# Patient Record
Sex: Female | Born: 1950 | Race: White | Hispanic: No | Marital: Married | Smoking: Former smoker
Health system: Southern US, Community
[De-identification: ages and names within clinical notes are randomized; demographics above are authoritative.]

## PROBLEM LIST (undated history)

## (undated) DIAGNOSIS — I499 Cardiac arrhythmia, unspecified: Secondary | ICD-10-CM

## (undated) DIAGNOSIS — C801 Malignant (primary) neoplasm, unspecified: Secondary | ICD-10-CM

## (undated) HISTORY — PX: ABDOMINAL HYSTERECTOMY: SHX81

---

## 1999-04-05 ENCOUNTER — Other Ambulatory Visit: Admission: RE | Admit: 1999-04-05 | Discharge: 1999-04-05 | Payer: Self-pay | Admitting: Family Medicine

## 2012-06-09 ENCOUNTER — Other Ambulatory Visit: Payer: Self-pay | Admitting: Family Medicine

## 2012-06-12 ENCOUNTER — Other Ambulatory Visit: Payer: Self-pay | Admitting: Family Medicine

## 2012-07-09 ENCOUNTER — Other Ambulatory Visit: Payer: Self-pay | Admitting: Family Medicine

## 2012-07-12 ENCOUNTER — Other Ambulatory Visit: Payer: Self-pay | Admitting: Family Medicine

## 2016-12-19 DIAGNOSIS — Z8541 Personal history of malignant neoplasm of cervix uteri: Secondary | ICD-10-CM | POA: Diagnosis not present

## 2016-12-19 DIAGNOSIS — Z87440 Personal history of urinary (tract) infections: Secondary | ICD-10-CM | POA: Diagnosis not present

## 2016-12-19 DIAGNOSIS — Z87891 Personal history of nicotine dependence: Secondary | ICD-10-CM | POA: Diagnosis not present

## 2016-12-19 DIAGNOSIS — N183 Chronic kidney disease, stage 3 (moderate): Secondary | ICD-10-CM | POA: Diagnosis not present

## 2016-12-19 DIAGNOSIS — R1900 Intra-abdominal and pelvic swelling, mass and lump, unspecified site: Secondary | ICD-10-CM | POA: Diagnosis not present

## 2016-12-19 DIAGNOSIS — R590 Localized enlarged lymph nodes: Secondary | ICD-10-CM | POA: Diagnosis not present

## 2016-12-19 DIAGNOSIS — R161 Splenomegaly, not elsewhere classified: Secondary | ICD-10-CM | POA: Diagnosis not present

## 2016-12-19 DIAGNOSIS — Z90710 Acquired absence of both cervix and uterus: Secondary | ICD-10-CM | POA: Diagnosis not present

## 2016-12-26 DIAGNOSIS — R1900 Intra-abdominal and pelvic swelling, mass and lump, unspecified site: Secondary | ICD-10-CM | POA: Diagnosis not present

## 2016-12-26 DIAGNOSIS — N390 Urinary tract infection, site not specified: Secondary | ICD-10-CM | POA: Diagnosis not present

## 2016-12-26 DIAGNOSIS — Z8541 Personal history of malignant neoplasm of cervix uteri: Secondary | ICD-10-CM | POA: Diagnosis not present

## 2016-12-26 DIAGNOSIS — D739 Disease of spleen, unspecified: Secondary | ICD-10-CM | POA: Diagnosis not present

## 2016-12-26 DIAGNOSIS — N189 Chronic kidney disease, unspecified: Secondary | ICD-10-CM | POA: Diagnosis not present

## 2016-12-26 DIAGNOSIS — R590 Localized enlarged lymph nodes: Secondary | ICD-10-CM | POA: Diagnosis not present

## 2017-01-15 DIAGNOSIS — R1900 Intra-abdominal and pelvic swelling, mass and lump, unspecified site: Secondary | ICD-10-CM | POA: Diagnosis not present

## 2017-01-15 DIAGNOSIS — N189 Chronic kidney disease, unspecified: Secondary | ICD-10-CM | POA: Diagnosis not present

## 2017-01-15 DIAGNOSIS — R591 Generalized enlarged lymph nodes: Secondary | ICD-10-CM | POA: Diagnosis not present

## 2017-01-15 DIAGNOSIS — Z8541 Personal history of malignant neoplasm of cervix uteri: Secondary | ICD-10-CM | POA: Diagnosis not present

## 2017-01-15 DIAGNOSIS — R161 Splenomegaly, not elsewhere classified: Secondary | ICD-10-CM | POA: Diagnosis not present

## 2017-01-23 ENCOUNTER — Other Ambulatory Visit (HOSPITAL_COMMUNITY)
Admission: RE | Admit: 2017-01-23 | Discharge: 2017-01-23 | Disposition: A | Discharge status: Home / Self Care | Payer: Medicare Other | Source: Ambulatory Visit | Attending: Oncology | Admitting: Oncology

## 2017-01-23 DIAGNOSIS — D509 Iron deficiency anemia, unspecified: Secondary | ICD-10-CM | POA: Diagnosis not present

## 2017-01-23 DIAGNOSIS — D739 Disease of spleen, unspecified: Secondary | ICD-10-CM | POA: Diagnosis not present

## 2017-01-23 DIAGNOSIS — D72829 Elevated white blood cell count, unspecified: Secondary | ICD-10-CM | POA: Diagnosis not present

## 2017-01-29 DIAGNOSIS — R161 Splenomegaly, not elsewhere classified: Secondary | ICD-10-CM | POA: Diagnosis not present

## 2017-02-11 ENCOUNTER — Encounter (HOSPITAL_COMMUNITY): Payer: Self-pay

## 2017-02-14 LAB — CHROMOSOME ANALYSIS, BONE MARROW

## 2017-02-17 DIAGNOSIS — N179 Acute kidney failure, unspecified: Secondary | ICD-10-CM

## 2017-02-17 DIAGNOSIS — D649 Anemia, unspecified: Secondary | ICD-10-CM | POA: Diagnosis not present

## 2017-02-17 DIAGNOSIS — R591 Generalized enlarged lymph nodes: Secondary | ICD-10-CM | POA: Diagnosis not present

## 2017-02-17 DIAGNOSIS — I10 Essential (primary) hypertension: Secondary | ICD-10-CM | POA: Diagnosis not present

## 2017-02-17 DIAGNOSIS — E871 Hypo-osmolality and hyponatremia: Secondary | ICD-10-CM

## 2017-02-17 DIAGNOSIS — N183 Chronic kidney disease, stage 3 (moderate): Secondary | ICD-10-CM | POA: Diagnosis not present

## 2017-02-17 DIAGNOSIS — I4891 Unspecified atrial fibrillation: Secondary | ICD-10-CM

## 2017-02-18 DIAGNOSIS — R591 Generalized enlarged lymph nodes: Secondary | ICD-10-CM | POA: Diagnosis not present

## 2017-02-18 DIAGNOSIS — R079 Chest pain, unspecified: Secondary | ICD-10-CM | POA: Diagnosis not present

## 2017-02-18 DIAGNOSIS — E871 Hypo-osmolality and hyponatremia: Secondary | ICD-10-CM | POA: Diagnosis not present

## 2017-02-18 DIAGNOSIS — N179 Acute kidney failure, unspecified: Secondary | ICD-10-CM | POA: Diagnosis not present

## 2017-02-18 DIAGNOSIS — E46 Unspecified protein-calorie malnutrition: Secondary | ICD-10-CM | POA: Diagnosis not present

## 2017-02-18 DIAGNOSIS — R161 Splenomegaly, not elsewhere classified: Secondary | ICD-10-CM

## 2017-02-18 DIAGNOSIS — R200 Anesthesia of skin: Secondary | ICD-10-CM | POA: Diagnosis not present

## 2017-02-18 DIAGNOSIS — D367 Benign neoplasm of other specified sites: Secondary | ICD-10-CM | POA: Diagnosis not present

## 2017-02-18 DIAGNOSIS — N183 Chronic kidney disease, stage 3 (moderate): Secondary | ICD-10-CM

## 2017-02-18 DIAGNOSIS — N390 Urinary tract infection, site not specified: Secondary | ICD-10-CM | POA: Diagnosis not present

## 2017-02-18 DIAGNOSIS — D649 Anemia, unspecified: Secondary | ICD-10-CM | POA: Diagnosis not present

## 2017-02-18 DIAGNOSIS — I4891 Unspecified atrial fibrillation: Secondary | ICD-10-CM | POA: Diagnosis not present

## 2017-02-18 DIAGNOSIS — I10 Essential (primary) hypertension: Secondary | ICD-10-CM | POA: Diagnosis not present

## 2017-02-19 DIAGNOSIS — I10 Essential (primary) hypertension: Secondary | ICD-10-CM | POA: Diagnosis not present

## 2017-02-19 DIAGNOSIS — E871 Hypo-osmolality and hyponatremia: Secondary | ICD-10-CM | POA: Diagnosis not present

## 2017-02-19 DIAGNOSIS — N390 Urinary tract infection, site not specified: Secondary | ICD-10-CM

## 2017-02-19 DIAGNOSIS — R079 Chest pain, unspecified: Secondary | ICD-10-CM | POA: Diagnosis not present

## 2017-02-19 DIAGNOSIS — I4891 Unspecified atrial fibrillation: Secondary | ICD-10-CM | POA: Diagnosis not present

## 2017-02-19 DIAGNOSIS — N179 Acute kidney failure, unspecified: Secondary | ICD-10-CM | POA: Diagnosis not present

## 2017-02-19 DIAGNOSIS — N183 Chronic kidney disease, stage 3 (moderate): Secondary | ICD-10-CM | POA: Diagnosis not present

## 2017-02-20 DIAGNOSIS — N183 Chronic kidney disease, stage 3 (moderate): Secondary | ICD-10-CM | POA: Diagnosis not present

## 2017-02-20 DIAGNOSIS — E871 Hypo-osmolality and hyponatremia: Secondary | ICD-10-CM | POA: Diagnosis not present

## 2017-02-20 DIAGNOSIS — N289 Disorder of kidney and ureter, unspecified: Secondary | ICD-10-CM | POA: Diagnosis not present

## 2017-02-20 DIAGNOSIS — R079 Chest pain, unspecified: Secondary | ICD-10-CM | POA: Diagnosis not present

## 2017-02-20 DIAGNOSIS — R161 Splenomegaly, not elsewhere classified: Secondary | ICD-10-CM

## 2017-02-20 DIAGNOSIS — I4891 Unspecified atrial fibrillation: Secondary | ICD-10-CM | POA: Diagnosis not present

## 2017-02-20 DIAGNOSIS — I10 Essential (primary) hypertension: Secondary | ICD-10-CM | POA: Diagnosis not present

## 2017-02-20 DIAGNOSIS — E46 Unspecified protein-calorie malnutrition: Secondary | ICD-10-CM

## 2017-02-20 DIAGNOSIS — N179 Acute kidney failure, unspecified: Secondary | ICD-10-CM | POA: Diagnosis not present

## 2017-02-20 DIAGNOSIS — D649 Anemia, unspecified: Secondary | ICD-10-CM | POA: Diagnosis not present

## 2017-02-20 DIAGNOSIS — R599 Enlarged lymph nodes, unspecified: Secondary | ICD-10-CM | POA: Diagnosis not present

## 2017-02-21 DIAGNOSIS — E871 Hypo-osmolality and hyponatremia: Secondary | ICD-10-CM | POA: Diagnosis not present

## 2017-02-21 DIAGNOSIS — I4891 Unspecified atrial fibrillation: Secondary | ICD-10-CM | POA: Diagnosis not present

## 2017-02-21 DIAGNOSIS — N179 Acute kidney failure, unspecified: Secondary | ICD-10-CM | POA: Diagnosis not present

## 2017-02-22 DIAGNOSIS — I4891 Unspecified atrial fibrillation: Secondary | ICD-10-CM | POA: Diagnosis not present

## 2017-02-22 DIAGNOSIS — N179 Acute kidney failure, unspecified: Secondary | ICD-10-CM | POA: Diagnosis not present

## 2017-02-22 DIAGNOSIS — E871 Hypo-osmolality and hyponatremia: Secondary | ICD-10-CM | POA: Diagnosis not present

## 2017-02-26 DIAGNOSIS — N189 Chronic kidney disease, unspecified: Secondary | ICD-10-CM | POA: Diagnosis not present

## 2017-02-26 DIAGNOSIS — R161 Splenomegaly, not elsewhere classified: Secondary | ICD-10-CM | POA: Diagnosis not present

## 2017-02-26 DIAGNOSIS — Z87440 Personal history of urinary (tract) infections: Secondary | ICD-10-CM | POA: Diagnosis not present

## 2017-02-26 DIAGNOSIS — E46 Unspecified protein-calorie malnutrition: Secondary | ICD-10-CM | POA: Diagnosis not present

## 2017-02-26 DIAGNOSIS — D367 Benign neoplasm of other specified sites: Secondary | ICD-10-CM | POA: Diagnosis not present

## 2017-02-26 DIAGNOSIS — D649 Anemia, unspecified: Secondary | ICD-10-CM | POA: Diagnosis not present

## 2017-02-26 DIAGNOSIS — Z7901 Long term (current) use of anticoagulants: Secondary | ICD-10-CM | POA: Diagnosis not present

## 2017-02-26 DIAGNOSIS — D696 Thrombocytopenia, unspecified: Secondary | ICD-10-CM | POA: Diagnosis not present

## 2017-02-26 DIAGNOSIS — I4891 Unspecified atrial fibrillation: Secondary | ICD-10-CM | POA: Diagnosis not present

## 2017-02-26 DIAGNOSIS — D72829 Elevated white blood cell count, unspecified: Secondary | ICD-10-CM | POA: Diagnosis not present

## 2017-03-06 ENCOUNTER — Other Ambulatory Visit (HOSPITAL_COMMUNITY)
Admission: RE | Admit: 2017-03-06 | Discharge: 2017-03-06 | Disposition: A | Discharge status: Home / Self Care | Payer: Medicare Other | Source: Ambulatory Visit | Attending: Surgery | Admitting: Surgery

## 2017-03-09 ENCOUNTER — Inpatient Hospital Stay (HOSPITAL_COMMUNITY)
Admission: AD | Admit: 2017-03-09 | Discharge: 2017-03-11 | DRG: 055 | Disposition: A | Discharge status: Home / Self Care | Payer: Medicare Other | Source: Other Acute Inpatient Hospital | Attending: Internal Medicine | Admitting: Internal Medicine

## 2017-03-09 ENCOUNTER — Observation Stay (HOSPITAL_COMMUNITY): Payer: Medicare Other

## 2017-03-09 ENCOUNTER — Encounter (HOSPITAL_COMMUNITY): Payer: Self-pay | Admitting: *Deleted

## 2017-03-09 DIAGNOSIS — J90 Pleural effusion, not elsewhere classified: Secondary | ICD-10-CM

## 2017-03-09 DIAGNOSIS — D72828 Other elevated white blood cell count: Secondary | ICD-10-CM | POA: Diagnosis not present

## 2017-03-09 DIAGNOSIS — N183 Chronic kidney disease, stage 3 unspecified: Secondary | ICD-10-CM | POA: Diagnosis present

## 2017-03-09 DIAGNOSIS — C8590 Non-Hodgkin lymphoma, unspecified, unspecified site: Secondary | ICD-10-CM | POA: Diagnosis present

## 2017-03-09 DIAGNOSIS — C7931 Secondary malignant neoplasm of brain: Secondary | ICD-10-CM | POA: Diagnosis not present

## 2017-03-09 DIAGNOSIS — R51 Headache: Secondary | ICD-10-CM | POA: Diagnosis present

## 2017-03-09 DIAGNOSIS — Z87891 Personal history of nicotine dependence: Secondary | ICD-10-CM

## 2017-03-09 DIAGNOSIS — G40409 Other generalized epilepsy and epileptic syndromes, not intractable, without status epilepticus: Secondary | ICD-10-CM | POA: Diagnosis not present

## 2017-03-09 DIAGNOSIS — Z880 Allergy status to penicillin: Secondary | ICD-10-CM | POA: Diagnosis not present

## 2017-03-09 DIAGNOSIS — R569 Unspecified convulsions: Secondary | ICD-10-CM

## 2017-03-09 DIAGNOSIS — Z7901 Long term (current) use of anticoagulants: Secondary | ICD-10-CM | POA: Diagnosis not present

## 2017-03-09 DIAGNOSIS — C8330 Diffuse large B-cell lymphoma, unspecified site: Secondary | ICD-10-CM

## 2017-03-09 DIAGNOSIS — G939 Disorder of brain, unspecified: Secondary | ICD-10-CM

## 2017-03-09 DIAGNOSIS — C8190 Hodgkin lymphoma, unspecified, unspecified site: Secondary | ICD-10-CM | POA: Diagnosis not present

## 2017-03-09 DIAGNOSIS — D631 Anemia in chronic kidney disease: Secondary | ICD-10-CM | POA: Diagnosis present

## 2017-03-09 DIAGNOSIS — G9389 Other specified disorders of brain: Secondary | ICD-10-CM | POA: Diagnosis present

## 2017-03-09 DIAGNOSIS — I4891 Unspecified atrial fibrillation: Secondary | ICD-10-CM | POA: Diagnosis present

## 2017-03-09 DIAGNOSIS — I480 Paroxysmal atrial fibrillation: Secondary | ICD-10-CM | POA: Diagnosis present

## 2017-03-09 DIAGNOSIS — I129 Hypertensive chronic kidney disease with stage 1 through stage 4 chronic kidney disease, or unspecified chronic kidney disease: Secondary | ICD-10-CM | POA: Diagnosis not present

## 2017-03-09 DIAGNOSIS — Z79899 Other long term (current) drug therapy: Secondary | ICD-10-CM

## 2017-03-09 DIAGNOSIS — I10 Essential (primary) hypertension: Secondary | ICD-10-CM | POA: Diagnosis present

## 2017-03-09 HISTORY — DX: Cardiac arrhythmia, unspecified: I49.9

## 2017-03-09 HISTORY — DX: Malignant (primary) neoplasm, unspecified: C80.1

## 2017-03-09 LAB — CBC WITH DIFFERENTIAL/PLATELET
BASOS PCT: 0 %
Basophils Absolute: 0 10*3/uL (ref 0.0–0.1)
Eosinophils Absolute: 0.1 10*3/uL (ref 0.0–0.7)
Eosinophils Relative: 1 %
HEMATOCRIT: 31.2 % — AB (ref 36.0–46.0)
HEMOGLOBIN: 9.7 g/dL — AB (ref 12.0–15.0)
LYMPHS ABS: 1.1 10*3/uL (ref 0.7–4.0)
Lymphocytes Relative: 5 %
MCH: 24.1 pg — AB (ref 26.0–34.0)
MCHC: 31.1 g/dL (ref 30.0–36.0)
MCV: 77.6 fL — AB (ref 78.0–100.0)
MONO ABS: 1.7 10*3/uL — AB (ref 0.1–1.0)
MONOS PCT: 8 %
NEUTROS ABS: 18.4 10*3/uL — AB (ref 1.7–7.7)
NEUTROS PCT: 86 %
Platelets: 561 10*3/uL — ABNORMAL HIGH (ref 150–400)
RBC: 4.02 MIL/uL (ref 3.87–5.11)
RDW: 17.7 % — AB (ref 11.5–15.5)
WBC: 21.4 10*3/uL — ABNORMAL HIGH (ref 4.0–10.5)

## 2017-03-09 LAB — COMPREHENSIVE METABOLIC PANEL
ALBUMIN: 1.9 g/dL — AB (ref 3.5–5.0)
ALT: 7 U/L — AB (ref 14–54)
AST: 22 U/L (ref 15–41)
Alkaline Phosphatase: 74 U/L (ref 38–126)
Anion gap: 8 (ref 5–15)
BILIRUBIN TOTAL: 0.8 mg/dL (ref 0.3–1.2)
BUN: 30 mg/dL — AB (ref 6–20)
CHLORIDE: 104 mmol/L (ref 101–111)
CO2: 19 mmol/L — ABNORMAL LOW (ref 22–32)
CREATININE: 1.77 mg/dL — AB (ref 0.44–1.00)
Calcium: 8.5 mg/dL — ABNORMAL LOW (ref 8.9–10.3)
GFR calc Af Amer: 33 mL/min — ABNORMAL LOW (ref >60)
GFR, EST NON AFRICAN AMERICAN: 29 mL/min — AB (ref >60)
GLUCOSE: 79 mg/dL (ref 65–99)
POTASSIUM: 4.4 mmol/L (ref 3.5–5.1)
Sodium: 131 mmol/L — ABNORMAL LOW (ref 135–145)
TOTAL PROTEIN: 5.8 g/dL — AB (ref 6.5–8.1)

## 2017-03-09 LAB — MAGNESIUM: MAGNESIUM: 1.9 mg/dL (ref 1.7–2.4)

## 2017-03-09 LAB — PHOSPHORUS: Phosphorus: 4.2 mg/dL (ref 2.5–4.6)

## 2017-03-09 MED ORDER — ACETAMINOPHEN 325 MG PO TABS
650.0000 mg | ORAL_TABLET | Freq: Four times a day (QID) | ORAL | Status: DC | PRN
  Administered 2017-03-09 – 2017-03-10 (×4): 650 mg via ORAL
  Filled 2017-03-09 (×4): qty 2

## 2017-03-09 MED ORDER — ONDANSETRON HCL 4 MG/2ML IJ SOLN: 4.0000 mg | Freq: Four times a day (QID) | INTRAMUSCULAR | Status: DC | PRN

## 2017-03-09 MED ORDER — ONDANSETRON HCL 4 MG PO TABS: 4.0000 mg | ORAL_TABLET | Freq: Four times a day (QID) | ORAL | Status: DC | PRN

## 2017-03-09 MED ORDER — ACETAMINOPHEN 650 MG RE SUPP: 650.0000 mg | Freq: Four times a day (QID) | RECTAL | Status: DC | PRN

## 2017-03-09 MED ORDER — LEVETIRACETAM 500 MG/5ML IV SOLN
500.0000 mg | Freq: Two times a day (BID) | INTRAVENOUS | Status: DC
  Administered 2017-03-09 – 2017-03-11 (×4): 500 mg via INTRAVENOUS
  Filled 2017-03-09 (×4): qty 5

## 2017-03-09 NOTE — H&P (Signed)
History and Physical    Helen Douglas GEZ:[id number] DOB: 08/16/1950 DOA: 03/09/2017  PCP: No primary care provider on file.  Patient coming from: Patient was transferred from [facility].  Chief Complaint: Brain mass and seizures.  HPI: Helen Douglas is a 66 y.o. female with history of chronic kidney disease and chronic anemia with recent diagnosis of Hodgkin's lymphoma and atrial fibrillation was witnessed to have a seizure at home by patient's family at around 46 AM today while patient was sitting on the chair.  As per the patient's son who provided the history patient had a generalized tonic-clonic seizure which lasted for a few minutes and EMS was called and by then patient became confused.  Patient was brought to the ER at [facility].  As per the history by then the patient's seizure is tolerable patient was postictal.  CT of the head done initially without contrast followed by with contrast was done which showed enhancing 25 into 17-16 mm mass in the left middle cranial fossa which appears extra-axial in location.  Adjacent vasogenic not cytotoxic edema.  This lesion cannot be seen on recent MRI.  Differentials include meningioma versus metastatic disease from patient's lymphoma.  Patient was loaded with Keppra 1 g IV.  Chest x-ray done showed left pleural effusion.  EKG was showing normal sinus rhythm.  CBC WBCs 25 hemoglobin is 11.2 platelets are 647 sodium is 135 potassium is 5 BUN 30 creatinine 1.7 AST 40 ALT 17 alk phos 104 calcium 9.5 corrected calcium 10.5.   Patient states over the last 2 weeks patient has been feeling numb on the left side of the face with left temporal headache.  Patient also was not feeling well 3 weeks ago which prompted the workup for lymphoma.  Had biopsy of the right axillary lymph node last week.  Patient follows with oncologist Dr. Hinton Rao in [place].  ED Course: Patient was a direct admit.  Review of Systems: As per HPI, rest all  negative.   Past Medical History:  Diagnosis Date  . Cancer ([place])   . Dysrhythmia     Past Surgical History:  Procedure Laterality Date  . ABDOMINAL HYSTERECTOMY       reports that she quit smoking about 2 years ago. Her smoking use included Cigarettes. She has never used smokeless tobacco. She reports that she does not drink alcohol or use drugs.  Not on File  Family History  Problem Relation Age of Onset  . CAD Mother   . CAD Father   . CAD Son     Prior to Admission medications   Not on File    Physical Exam: Vitals:   03/09/17 1855 03/09/17 1905 03/09/17 2053  BP: (!) 117/37 (!) 114/40 (!) 121/48  Pulse: 74  77  Resp: 18  18  Temp: 97.7 F (36.5 C)  97.6 F (36.4 C)  TempSrc: Oral  Oral  SpO2: 96%  95%  Weight: 93.6 kg (206 lb 6.4 oz)    Height: 5' 5"  (1.651 m)        Constitutional: Moderately built and nourished. Vitals:   03/09/17 1855 03/09/17 1905 03/09/17 2053  BP: (!) 117/37 (!) 114/40 (!) 121/48  Pulse: 74  77  Resp: 18  18  Temp: 97.7 F (36.5 C)  97.6 F (36.4 C)  TempSrc: Oral  Oral  SpO2: 96%  95%  Weight: 93.6 kg (206 lb 6.4 oz)    Height: 5' 5"  (1.651 m)  Eyes: Anicteric no pallor. ENMT: No discharge from the ears eyes nose or mouth. Neck: Bilateral neck mass felt.  No neck rigidity. Respiratory: No rhonchi or crepitations. Cardiovascular: S1-S2 heard. Abdomen: Mildly tender in the left upper quadrant.  No guarding or rigidity. Musculoskeletal: No edema. Skin: No rash. Neurologic: Alert awake oriented to time place and person.  Moves all extremities. Psychiatric: Appears normal.   Labs on Admission: I have personally reviewed following labs and imaging studies  CBC: No results for input(s): WBC, NEUTROABS, HGB, HCT, MCV, PLT in the last 168 hours. Basic Metabolic Panel: No results for input(s): NA, K, CL, CO2, GLUCOSE, BUN, CREATININE, CALCIUM, MG, PHOS in the last 168 hours. GFR: CrCl cannot be calculated (No order  found.). Liver Function Tests: No results for input(s): AST, ALT, ALKPHOS, BILITOT, PROT, ALBUMIN in the last 168 hours. No results for input(s): LIPASE, AMYLASE in the last 168 hours. No results for input(s): AMMONIA in the last 168 hours. Coagulation Profile: No results for input(s): INR, PROTIME in the last 168 hours. Cardiac Enzymes: No results for input(s): CKTOTAL, CKMB, CKMBINDEX, TROPONINI in the last 168 hours. BNP (last 3 results) No results for input(s): PROBNP in the last 8760 hours. HbA1C: No results for input(s): HGBA1C in the last 72 hours. CBG: No results for input(s): GLUCAP in the last 168 hours. Lipid Profile: No results for input(s): CHOL, HDL, LDLCALC, TRIG, CHOLHDL, LDLDIRECT in the last 72 hours. Thyroid Function Tests: No results for input(s): TSH, T4TOTAL, FREET4, T3FREE, THYROIDAB in the last 72 hours. Anemia Panel: No results for input(s): VITAMINB12, FOLATE, FERRITIN, TIBC, IRON, RETICCTPCT in the last 72 hours. Urine analysis: No results found for: COLORURINE, APPEARANCEUR, LABSPEC, PHURINE, GLUCOSEU, HGBUR, BILIRUBINUR, KETONESUR, PROTEINUR, UROBILINOGEN, NITRITE, LEUKOCYTESUR Sepsis Labs: @LABRCNTIP (procalcitonin:4,lacticidven:4) )No results found for this or any previous visit (from the past 240 hour(s)).   Radiological Exams on Admission: No results found.  EKG: Independently reviewed.  Normal sinus rhythm.  EKG done at [facility].  Assessment/Plan Active Problems:   CKD (chronic kidney disease) stage 3, GFR 30-59 ml/min (HCC)   Essential hypertension   Atrial fibrillation (HCC)   Lymphoma (HCC)   Brain mass   Seizures ([place])    1. Brain mass with seizure - discussed with Dr. Vertell Limber on-call neurosurgeon will be seeing patient in consult.  Dr. Vertell Limber has requested to get MRI brain without contrast (due to chronic kidney disease unable to give contrast) and brain lab protocol.  Have also discussed with on-call neurologist Dr. Cheral Marker who  has advised at this time to continue Keppra 500 mg IV every 12 for seizure precautions (discussed with neurologist about the renal clearance). 2. Paroxysmal atrial fibrillation -presently in sinus rhythm.  Holding off apixaban due to possible procedures, family agreeable.  On metoprolol. 3. Chronic kidney disease stage III - check metabolic panel.  Follow intake output. 4. Chronic anemia probably from chronic kidney disease - follow CBC. 5. Recently diagnosed lymphoma - per oncologist. 6. Leukocytosis - likely from [place]. 7. Left pleural effusion per the chest x-ray done at [facility] - we will do 2 view chest x-ray and based on which we will see if patient needs thoracentesis.  Patient presently not in distress. 8. Hypertension on metoprolol.   DVT prophylaxis: SCDs in anticipation of procedure. Code Status: Full code. Family Communication: Son and daughter. Disposition Plan: Home. Consults called: Neurosurgery. Admission status: Observation.   Rise Patience MD [facility] Pager [phone].  If 7PM-7AM, please contact night-coverage [email or web address] Password  [email or web address]  03/09/2017, 10:01 PM

## 2017-03-09 NOTE — Plan of Care (Signed)
Ms. Helen Douglas is a 66 year old female with medical history significant for non--Hodgkin's lymphoma (unclear diagnosis, lymph node biopsy 10/18) followed by Dr. Hosie Poisson who presented to the ED with reports of seizure-like activity.  Family states patient was at her usual state of health until this morning.  Family reports patient was less alert, had difficulty answering questions, and had an episode of whole body jerking, and shaking head.  Per ED provider (PA Kendricks) patient arrived in somewhat of a post ictal state the patient was loaded with Keppra.  Since then patient has become more alert and is oriented.   Reported vitals: blood pressure 90-110/50-60.  For which patient was given IV fluids.  Lab work notable for leukocytosis of 25 (previously 17 on 10/13) , creatinine 1.7 (baseline 1.3-1.7).  Patient has not had any seizure-like activity in the ED.    Given patient's complaint of headache CT head was obtained which was concerning for 25 x 17 x 16 mm enhancing mass.  Concern for meningioma versus metastatic lesion.  No reported midline shift.  Localized mass-effect  Of note brain MRI without contrast on 10/3 did not show any brain lesion per radiologist report  Patient will come to [facility] observation for possible neurosurgical recommendations for mass.  --per nursing will likely need in and out cath orders  Nurse please let flow manager know of patient's arrival to the floor  Shayla D Nettey

## 2017-03-09 NOTE — Progress Notes (Signed)
Helen Douglas [id number] Admitted to 9I33: 03/09/2017 8:12 PM Attending Provider: Rise Patience, MD    Helen Douglas is a 66 y.o. female patient admitted from ED awake, alert  & orientated  X 3,  No Order, VSS - Blood pressure (!) 114/40, pulse 74, temperature 97.7 F (36.5 C), temperature source Oral, resp. rate 18, height 5\' 5"  (1.651 m), weight 93.6 kg (206 lb 6.4 oz), SpO2 96 %.RA, no c/o shortness of breath, no c/o chest pain, no distress noted.    IV site WDL:  with a transparent dsg that's clean dry and intact.  Allergies:  Allergies not on file   No past medical history on file.  History:  obtained from patient  Pt orientation to unit, room and routine. Information packet given to patient. Admission INP armband ID verified with patient/family, and in place. SR up x 2, fall risk assessment complete with Patient and family verbalizing understanding of risks associated with falls. Pt verbalizes an understanding of how to use the call bell and to call for help before getting out of bed.  Skin, see surgical incision flow sheet.   Will cont to monitor and assist as needed.  Parthenia Ames, RN 03/09/2017 8:12 PM

## 2017-03-10 ENCOUNTER — Observation Stay (HOSPITAL_COMMUNITY): Payer: Medicare Other

## 2017-03-10 DIAGNOSIS — I129 Hypertensive chronic kidney disease with stage 1 through stage 4 chronic kidney disease, or unspecified chronic kidney disease: Secondary | ICD-10-CM | POA: Diagnosis present

## 2017-03-10 DIAGNOSIS — C8330 Diffuse large B-cell lymphoma, unspecified site: Secondary | ICD-10-CM | POA: Diagnosis present

## 2017-03-10 DIAGNOSIS — N183 Chronic kidney disease, stage 3 (moderate): Secondary | ICD-10-CM | POA: Diagnosis present

## 2017-03-10 DIAGNOSIS — C8190 Hodgkin lymphoma, unspecified, unspecified site: Secondary | ICD-10-CM | POA: Diagnosis present

## 2017-03-10 DIAGNOSIS — R569 Unspecified convulsions: Secondary | ICD-10-CM | POA: Diagnosis not present

## 2017-03-10 DIAGNOSIS — G939 Disorder of brain, unspecified: Secondary | ICD-10-CM | POA: Diagnosis present

## 2017-03-10 DIAGNOSIS — Z7901 Long term (current) use of anticoagulants: Secondary | ICD-10-CM | POA: Diagnosis not present

## 2017-03-10 DIAGNOSIS — R0902 Hypoxemia: Secondary | ICD-10-CM

## 2017-03-10 DIAGNOSIS — Z87891 Personal history of nicotine dependence: Secondary | ICD-10-CM | POA: Diagnosis not present

## 2017-03-10 DIAGNOSIS — I10 Essential (primary) hypertension: Secondary | ICD-10-CM | POA: Diagnosis not present

## 2017-03-10 DIAGNOSIS — J90 Pleural effusion, not elsewhere classified: Secondary | ICD-10-CM

## 2017-03-10 DIAGNOSIS — C7931 Secondary malignant neoplasm of brain: Secondary | ICD-10-CM | POA: Diagnosis present

## 2017-03-10 DIAGNOSIS — R0602 Shortness of breath: Secondary | ICD-10-CM | POA: Diagnosis not present

## 2017-03-10 DIAGNOSIS — D72828 Other elevated white blood cell count: Secondary | ICD-10-CM | POA: Diagnosis present

## 2017-03-10 DIAGNOSIS — C8338 Diffuse large B-cell lymphoma, lymph nodes of multiple sites: Secondary | ICD-10-CM | POA: Diagnosis not present

## 2017-03-10 DIAGNOSIS — D631 Anemia in chronic kidney disease: Secondary | ICD-10-CM | POA: Diagnosis present

## 2017-03-10 DIAGNOSIS — Z79899 Other long term (current) drug therapy: Secondary | ICD-10-CM | POA: Diagnosis not present

## 2017-03-10 DIAGNOSIS — Z880 Allergy status to penicillin: Secondary | ICD-10-CM | POA: Diagnosis not present

## 2017-03-10 DIAGNOSIS — I480 Paroxysmal atrial fibrillation: Secondary | ICD-10-CM | POA: Diagnosis present

## 2017-03-10 DIAGNOSIS — R51 Headache: Secondary | ICD-10-CM | POA: Diagnosis present

## 2017-03-10 DIAGNOSIS — G40409 Other generalized epilepsy and epileptic syndromes, not intractable, without status epilepticus: Secondary | ICD-10-CM | POA: Diagnosis present

## 2017-03-10 DIAGNOSIS — C8591 Non-Hodgkin lymphoma, unspecified, lymph nodes of head, face, and neck: Secondary | ICD-10-CM

## 2017-03-10 LAB — CBC
HCT: 31.2 % — ABNORMAL LOW (ref 36.0–46.0)
Hemoglobin: 9.5 g/dL — ABNORMAL LOW (ref 12.0–15.0)
MCH: 23.9 pg — AB (ref 26.0–34.0)
MCHC: 30.4 g/dL (ref 30.0–36.0)
MCV: 78.6 fL (ref 78.0–100.0)
PLATELETS: 592 10*3/uL — AB (ref 150–400)
RBC: 3.97 MIL/uL (ref 3.87–5.11)
RDW: 17.5 % — AB (ref 11.5–15.5)
WBC: 22 10*3/uL — ABNORMAL HIGH (ref 4.0–10.5)

## 2017-03-10 LAB — BASIC METABOLIC PANEL
Anion gap: 14 (ref 5–15)
BUN: 29 mg/dL — AB (ref 6–20)
CALCIUM: 8.5 mg/dL — AB (ref 8.9–10.3)
CHLORIDE: 102 mmol/L (ref 101–111)
CO2: 18 mmol/L — AB (ref 22–32)
CREATININE: 1.77 mg/dL — AB (ref 0.44–1.00)
GFR calc Af Amer: 33 mL/min — ABNORMAL LOW (ref >60)
GFR calc non Af Amer: 29 mL/min — ABNORMAL LOW (ref >60)
GLUCOSE: 78 mg/dL (ref 65–99)
Potassium: 4.3 mmol/L (ref 3.5–5.1)
Sodium: 134 mmol/L — ABNORMAL LOW (ref 135–145)

## 2017-03-10 LAB — BODY FLUID CELL COUNT WITH DIFFERENTIAL
LYMPHS FL: 73 %
MONOCYTE-MACROPHAGE-SEROUS FLUID: 4 % — AB (ref 50–90)
NEUTROPHIL FLUID: 23 % (ref 0–25)
Total Nucleated Cell Count, Fluid: 1315 cu mm — ABNORMAL HIGH (ref 0–1000)

## 2017-03-10 LAB — LACTATE DEHYDROGENASE: LDH: 966 U/L — AB (ref 98–192)

## 2017-03-10 LAB — PROTIME-INR
INR: 1.34
PROTHROMBIN TIME: 16.5 s — AB (ref 11.4–15.2)

## 2017-03-10 LAB — CHOLESTEROL, TOTAL: Cholesterol: 115 mg/dL (ref 0–200)

## 2017-03-10 LAB — PROTEIN, TOTAL: TOTAL PROTEIN: 6 g/dL — AB (ref 6.5–8.1)

## 2017-03-10 MED ORDER — GLUCERNA SHAKE PO LIQD
237.0000 mL | Freq: Three times a day (TID) | ORAL | Status: DC
  Administered 2017-03-10 (×2): 237 mL via ORAL
  Filled 2017-03-10 (×2): qty 237

## 2017-03-10 MED ORDER — DEXAMETHASONE 4 MG PO TABS
4.0000 mg | ORAL_TABLET | Freq: Four times a day (QID) | ORAL | Status: DC
  Administered 2017-03-10 – 2017-03-11 (×5): 4 mg via ORAL
  Filled 2017-03-10 (×5): qty 1

## 2017-03-10 MED ORDER — METOPROLOL TARTRATE 25 MG PO TABS
25.0000 mg | ORAL_TABLET | Freq: Two times a day (BID) | ORAL | Status: DC
  Administered 2017-03-10 – 2017-03-11 (×3): 25 mg via ORAL
  Filled 2017-03-10 (×3): qty 1

## 2017-03-10 MED ORDER — METOPROLOL TARTRATE 5 MG/5ML IV SOLN
INTRAVENOUS | Status: AC
  Filled 2017-03-10: qty 5

## 2017-03-10 MED ORDER — FLUOXETINE HCL 20 MG PO CAPS
40.0000 mg | ORAL_CAPSULE | Freq: Every day | ORAL | Status: DC
  Administered 2017-03-10 – 2017-03-11 (×2): 40 mg via ORAL
  Filled 2017-03-10 (×2): qty 2

## 2017-03-10 MED ORDER — METOPROLOL TARTRATE 5 MG/5ML IV SOLN
5.0000 mg | Freq: Once | INTRAVENOUS | Status: AC
  Administered 2017-03-10: 5 mg via INTRAVENOUS

## 2017-03-10 NOTE — Procedures (Signed)
Thoracentesis Procedure Note  Pre-operative Diagnosis: Pleural effusion, left  Post-operative Diagnosis: same  Indications: Same  Procedure Details  Consent: Informed consent was obtained. Risks of the procedure were discussed including: infection, bleeding, pain, pneumothorax.  Under sterile conditions the patient was positioned. Betadine solution and sterile drapes were utilized.  1% buffered lidocaine was used to anesthetize the 5 rib space. Fluid was obtained without any difficulties and minimal blood loss.  A dressing was applied to the wound and wound care instructions were provided.   Findings 1100 ml of cloudy pleural fluid was obtained. A sample was sent to Pathology for cytogenetics, flow, and cell counts, as well as for infection analysis.  Complications:  None; patient tolerated the procedure well.          Condition: stable  Plan A follow up chest x-ray was ordered. Bed Rest for 0 hours. Tylenol 650 mg. for pain.  Attending Attestation: I was present for the entire procedure.  Rush Farmer, M.D. [facility] Pulmonary/Critical Care Medicine. Pager: [phone]. After hours pager: [phone].

## 2017-03-10 NOTE — Progress Notes (Signed)
PROGRESS NOTE    Helen Douglas  QAS:[id number] DOB: 08-27-1950 DOA: 03/09/2017 PCP: No primary care provider on file.   Outpatient Specialists:     Brief Narrative:  Helen Douglas is a 66 y.o. female with history of chronic kidney disease and chronic anemia with recent diagnosis of Hodgkin's lymphoma and atrial fibrillation was witnessed to have a seizure at home by patient's family at around 63 AM today while patient was sitting on the chair.  As per the patient's son who provided the history patient had a generalized tonic-clonic seizure which lasted for a few minutes and EMS was called and by then patient became confused.  Patient was brought to the ER at [facility].  As per the history by then the patient's seizure is tolerable patient was postictal.  CT of the head done initially without contrast followed by with contrast was done which showed enhancing 25 into 17-16 mm mass in the left middle cranial fossa which appears extra-axial in location.  Adjacent vasogenic not cytotoxic edema.  This lesion cannot be seen on recent MRI.  Differentials include meningioma versus metastatic disease from patient's lymphoma.  Patient was loaded with Keppra 1 g IV.  Chest x-ray done showed left pleural effusion.  EKG was showing normal sinus rhythm.  CBC WBCs 25 hemoglobin is 11.2 platelets are 647 sodium is 135 potassium is 5 BUN 30 creatinine 1.7 AST 40 ALT 17 alk phos 104 calcium 9.5 corrected calcium 10.5.   Patient states over the last 2 weeks patient has been feeling numb on the left side of the face with left temporal headache.  Patient also was not feeling well 3 weeks ago which prompted the workup for lymphoma.  Had biopsy of the right axillary lymph node last week.  Patient follows with oncologist Dr. Hinton Rao in [place].    Assessment & Plan:   Principal Problem:   Brain mass Active Problems:   CKD (chronic kidney disease) stage 3, GFR 30-59 ml/min (HCC)   Essential hypertension  Atrial fibrillation (HCC)   Lymphoma (HCC)   Seizures (HCC)   Brain mass with seizure - -keppra -decadron -no surgical intervention -needs aggressive staging/intervention by oncology-- has oncology at [facility] with Dr. Hinton Rao: Has follow up on Wednesday-- in agreement she needs aggressive treatment  Left sided facial pain -gabapentin if pain worsens  Paroxysmal atrial fibrillation -hold eliquis -resume BB  Chronic kidney disease stage III  -follow  Chronic anemia probably from chronic kidney disease - follow CBC.  Recently diagnosed lymphoma? -have requested records from [place] -non-Hodgkin Lymphoma- high grade -asked Dr. Inda Merlin to see here in hospital-- he deferred to patient's oncologist-- have left message -patient states she had BMB  Leukocytosis - likely from [place].  Left pleural effusion per the chest x-ray done at [facility]  -having SOB -will get PCCM to do thoracentesis-- would like labs including cytology/flow cytometry to be done  Hypertension  -resume metoprolol.  Run of a fib with RVR -holding eliquis -resume BB -converted back to sinus with IV BB   DVT prophylaxis:  SCD's  Code Status: Full Code  Family Communication: On phone  Disposition Plan:     Consultants:    Subjective: Having SOB- quit smoking 2 years ago  Objective: Vitals:   03/09/17 1905 03/09/17 2053 03/10/17 0910 03/10/17 0920  BP: (!) 114/40 (!) 121/48 115/69 123/65  Pulse:  77 (!) 170 80  Resp:  18 18   Temp:  97.6 F (36.4 C)  TempSrc:  Oral    SpO2:  95% 98%   Weight:      Height:        Intake/Output Summary (Last 24 hours) at 03/10/17 1210 Last data filed at 03/10/17 1111  Gross per 24 hour  Intake              220 ml  Output             1025 ml  Net             -805 ml   Filed Weights   03/09/17 1855  Weight: 93.6 kg (206 lb 6.4 oz)    Examination:  General exam: Appears calm and comfortable  Respiratory system: diminished  left side Cardiovascular system: rrr (after I left room- had run of a fib with RVR) Gastrointestinal system: Abdomen is nondistended, soft and nontender. No organomegaly or masses felt. Normal bowel sounds heard. Central nervous system: Alert and oriented. No focal neurological deficits. Extremities: Symmetric 5 x 5 power. Skin: No rashes, lesions or ulcers Psychiatry: Judgement and insight appear normal. Mood & affect appropriate.     Data Reviewed: I have personally reviewed following labs and imaging studies  CBC:  Recent Labs Lab 03/09/17 2211 03/10/17 0706  WBC 21.4* 22.0*  NEUTROABS 18.4*  --   HGB 9.7* 9.5*  HCT 31.2* 31.2*  MCV 77.6* 78.6  PLT 561* 546*   Basic Metabolic Panel:  Recent Labs Lab 03/09/17 2211 03/10/17 0706  NA 131* 134*  K 4.4 4.3  CL 104 102  CO2 19* 18*  GLUCOSE 79 78  BUN 30* 29*  CREATININE 1.77* 1.77*  CALCIUM 8.5* 8.5*  MG 1.9  --   PHOS 4.2  --    GFR: Estimated Creatinine Clearance: 35.3 mL/min (A) (by C-G formula based on SCr of 1.77 mg/dL (H)). Liver Function Tests:  Recent Labs Lab 03/09/17 2211  AST 22  ALT 7*  ALKPHOS 74  BILITOT 0.8  PROT 5.8*  ALBUMIN 1.9*   No results for input(s): LIPASE, AMYLASE in the last 168 hours. No results for input(s): AMMONIA in the last 168 hours. Coagulation Profile:  Recent Labs Lab 03/10/17 1030  INR 1.34   Cardiac Enzymes: No results for input(s): CKTOTAL, CKMB, CKMBINDEX, TROPONINI in the last 168 hours. BNP (last 3 results) No results for input(s): PROBNP in the last 8760 hours. HbA1C: No results for input(s): HGBA1C in the last 72 hours. CBG: No results for input(s): GLUCAP in the last 168 hours. Lipid Profile: No results for input(s): CHOL, HDL, LDLCALC, TRIG, CHOLHDL, LDLDIRECT in the last 72 hours. Thyroid Function Tests: No results for input(s): TSH, T4TOTAL, FREET4, T3FREE, THYROIDAB in the last 72 hours. Anemia Panel: No results for input(s): VITAMINB12,  FOLATE, FERRITIN, TIBC, IRON, RETICCTPCT in the last 72 hours. Urine analysis: No results found for: COLORURINE, APPEARANCEUR, LABSPEC, [place], GLUCOSEU, HGBUR, BILIRUBINUR, KETONESUR, PROTEINUR, UROBILINOGEN, NITRITE, LEUKOCYTESUR   )No results found for this or any previous visit (from the past 240 hour(s)).    Anti-infectives    None       Radiology Studies: Dg Chest 2 View  Result Date: 03/10/2017 CLINICAL DATA:  Acute onset of shortness of breath. Assess pleural effusion. Initial encounter. EXAM: CHEST  2 VIEW COMPARISON:  None. FINDINGS: A large left-sided pleural effusion is noted, with left basilar airspace opacification. The right lung appears clear. No pneumothorax is seen. The cardiomediastinal silhouette is normal in size. No acute osseous abnormalities are identified. A  right-sided chest port is noted ending about the mid SVC. IMPRESSION: Large left-sided pleural effusion, with left basilar airspace opacification. Underlying mass cannot be excluded. Diagnostic thoracentesis could be considered for further evaluation, as deemed clinically appropriate. Electronically Signed   By: Garald Balding M.D.   On: 03/10/2017 01:35   Mr Brain Wo Contrast  Result Date: 03/10/2017 CLINICAL DATA:  66 y/o  F; 66 y/o  F; brain mass. EXAM: MRI HEAD WITHOUT CONTRAST TECHNIQUE: Multiplanar, multiecho pulse sequences of the brain and surrounding structures were obtained without intravenous contrast. COMPARISON:  None. FINDINGS: Brain: Ill-defined mass centered within the left sphenoid wing measuring approximately 3.8 x 3.6 x 3.9 cm (AP x ML x CC series 7, image 187 and series 11, image 42). The mass demonstrates increased T2, decreased T1 signal as well as reduced diffusion. The mass invades the left aspect of clivus (series 554: Image 14), left medial masticator space (554 image 12), inferiorly as far as the left pterygoid plates (706:2). The mass extends into the intracranial compartment without a  defined margin with the anterior left temporal lobe likely representing brain invasion. Additionally, there is diffusely decreased fractional anisotropy within the left anterior temporal lobe probably representing both tumor invasion and associated edema. Clear delineation of the mass is suboptimal without intravenous contrast. No other focus of reduced diffusion is present within the brain parenchyma or appreciable wheezing with mass effect on T2 weighted imaging. Scattered nonspecific foci of T2 FLAIR hyperintense signal abnormality in subcortical and periventricular white matter are compatible with mild chronic microvascular ischemic changes for age. Mild brain parenchymal volume loss. No susceptibility hypointensity to indicate intracranial hemorrhage. Vascular: Normal flow voids. Skull and upper cervical spine: Subcentimeter hypointense focus within the clivus (series 2, image 18). Sinuses/Orbits: Negative. Other: Left anterior and posterior upper cervical as well as left-sided submandibular lymphadenopathy. Right upper posterior cervical lymphadenopathy. Addition, there is enlarged right-sided retropharyngeal node at level of nasopharynx (series 550, image 8). IMPRESSION: 1. Ill-defined mass centered within the left sphenoid wing measuring approximately 3.9 cm with invasion of left medial masticator space, left pterygoid plates, and probable invasion of the left anterior temporal lobe. Given the presence of extensive upper cervical adenopathy this likely represents metastasis. Differential includes extra-axial tumors such is atypical meningioma or hemangiopericytoma. 2. Right greater than left upper cervical lymphadenopathy, likely metastatic. 3. Subcentimeter T1 hypointense focus within the mid clivus may represent additional metastasis. 4. No additional mass effect within the brain to suggest metastasis. 5. Suboptimal assessment of tumor margins and small metastatic disease without intravenous contrast.  Electronically Signed   By: Kristine Garbe M.D.   On: 03/10/2017 06:55        Scheduled Meds: . dexamethasone  4 mg Oral Q6H  . FLUoxetine  40 mg Oral Daily  . metoprolol tartrate      . metoprolol tartrate  25 mg Oral BID   Continuous Infusions: . levETIRAcetam Stopped (03/10/17 1032)     LOS: 0 days    Time spent: 25 min    [place], DO [facility] Pager [phone]  If 7PM-7AM, please contact night-coverage [email or web address] Password [email or web address] 03/10/2017, 12:10 PM

## 2017-03-10 NOTE — Progress Notes (Signed)
Central tele called staff and informed that patient's HR is in 170s-180s. RN checked the patient; patient was resting in bed with no acute distress, alert and oriented. BP=115/69; HR=170; SatO2=98 RA Resp=18. MD informed and also rapid response nurse was called. Per MD order patient received Metoprolol 5 mg IV and ECG was done. HR came down to 80s-90s patient converted back to NSR from AFib. Will continue to monitor.

## 2017-03-10 NOTE — Consult Note (Signed)
Name: Helen Douglas MRN: [id number] DOB: [birth year]    ADMISSION DATE:  03/09/2017 CONSULTATION DATE: 10/22  REFERRING MD : Eliseo Squires  CHIEF COMPLAINT: Left pleural effusion  BRIEF PATIENT DESCRIPTION:  This is a 66 year old female with a known history of chronic kidney disease, chronic anemia, atrial fibrillation, and recent new diagnosis of Hodgkin's lymphoma.  She is followed by Dr. Hinton Rao at [place] for her cancer.  She was admitted on 10/21 after having a witnessed seizure which lasted a few minutes.  She was confused postictally.  Was initially brought to the emergency room at [facility], where a CT of head was done demonstrating 16-17 mm mass in the fossa.  She was loaded with Keppra, and transferred to [facility] for further evaluation by neurology as well as neurosurgery.  During routine evaluation in the emergency room she was also found to have a fairly large left pleural effusion, because of this pulmonary has been asked to evaluate.  SIGNIFICANT EVENTS    STUDIES:  MRI of brain 10/22: Ill-defined mass centered within the left sphenoid wing measuring approximately 3.9 cm.  Probable invasion of the left anterior temporal lobe.  Extensive upper cervical adenopathy.  Subcentimeter T1 hypointense focus within the mid clivus   HISTORY OF PRESENT ILLNESS: See above  PAST MEDICAL HISTORY :   has a past medical history of Cancer ([place]) and Dysrhythmia.  has a past surgical history that includes Abdominal hysterectomy. Prior to Admission medications   Medication Sig Start Date End Date Taking? Authorizing Provider  ELIQUIS 5 MG TABS tablet Take 5 mg by mouth 2 (two) times daily. 02/22/17   [provider]  FLUoxetine (PROZAC) 40 MG capsule Take 40 mg by mouth daily. 01/23/17   [provider]  lisinopril (PRINIVIL,ZESTRIL) 10 MG tablet Take 10 mg by mouth daily. 01/14/17   [provider]  metoprolol tartrate (LOPRESSOR) 25 MG tablet Take 25 mg by mouth  2 (two) times daily. 02/22/17   [provider]  ondansetron (ZOFRAN-ODT) 4 MG disintegrating tablet  03/05/17   [provider]   Not on File  FAMILY HISTORY:  family history includes CAD in her father, mother, and son. SOCIAL HISTORY:  reports that she quit smoking about 2 years ago. Her smoking use included Cigarettes. She has never used smokeless tobacco. She reports that she does not drink alcohol or use drugs.  REVIEW OF SYSTEMS:   Constitutional: Negative for fever, chills, weight loss, malaise/fatigue and diaphoresis.  HENT: Negative for hearing loss, ear pain, nosebleeds, congestion, sore throat, neck pain, tinnitus and ear discharge.   Eyes: Negative for blurred vision, double vision, photophobia, pain, discharge and redness.  Respiratory: Negative for cough, hemoptysis, sputum production, shortness of breath ~ 3 weeks, wheezing and stridor.   Cardiovascular: Negative for chest pain, palpitations, orthopnea, claudication, leg swelling and PND.  Gastrointestinal: Negative for heartburn, nausea, vomiting, abdominal pain, diarrhea, constipation, blood in stool and melena.  Genitourinary: Negative for dysuria, urgency, frequency, hematuria and flank pain.  Musculoskeletal: Negative for myalgias, back pain, joint pain and falls.  Skin: Negative for itching and rash.  Neurological:+ dizziness, tingling, tremors, sensory change, speech change, focal weakness, seizures, loss of consciousness, weakness and headaches.  Endo/Heme/Allergies: Negative for environmental allergies and polydipsia. Does not bruise/bleed easily.  SUBJECTIVE:  Short of breath  VITAL SIGNS: Temp:  [97.6 F (36.4 C)-97.7 F (36.5 C)] 97.6 F (36.4 C) (10/21 2053) Pulse Rate:  [74-170] 80 (10/22 0920) Resp:  [18] 18 (10/22  1478) BP: (114-123)/(37-69) 123/65 (10/22 0920) SpO2:  [95 %-98 %] 98 % (10/22 0910) Weight:  [206 lb 6.4 oz (93.6 kg)] 206 lb 6.4 oz (93.6 kg) (10/21 1855)  PHYSICAL  EXAMINATION: General:  Chronically ill appearing white female. NAD Neuro:  Awake, sp clear. Moves all extremities.  HEENT:  NCAT. MMM Cardiovascular:  RRR w/out MRG Lungs:  Clear, decreased left side Abdomen:  Soft not tender + bowel sounds  Musculoskeletal:  Soft, not tender  Skin:  Warm and dry    Recent Labs Lab 03/09/17 2211 03/10/17 0706  NA 131* 134*  K 4.4 4.3  CL 104 102  CO2 19* 18*  BUN 30* 29*  CREATININE 1.77* 1.77*  GLUCOSE 79 78    Recent Labs Lab 03/09/17 2211 03/10/17 0706  HGB 9.7* 9.5*  HCT 31.2* 31.2*  WBC 21.4* 22.0*  PLT 561* 592*   Dg Chest 2 View  Result Date: 03/10/2017 CLINICAL DATA:  Acute onset of shortness of breath. Assess pleural effusion. Initial encounter. EXAM: CHEST  2 VIEW COMPARISON:  None. FINDINGS: A large left-sided pleural effusion is noted, with left basilar airspace opacification. The right lung appears clear. No pneumothorax is seen. The cardiomediastinal silhouette is normal in size. No acute osseous abnormalities are identified. A right-sided chest port is noted ending about the mid SVC. IMPRESSION: Large left-sided pleural effusion, with left basilar airspace opacification. Underlying mass cannot be excluded. Diagnostic thoracentesis could be considered for further evaluation, as deemed clinically appropriate. Electronically Signed   By: Garald Balding M.D.   On: 03/10/2017 01:35   Mr Brain Wo Contrast  Result Date: 03/10/2017 CLINICAL DATA:  66 y/o  F; 66 y/o  F; brain mass. EXAM: MRI HEAD WITHOUT CONTRAST TECHNIQUE: Multiplanar, multiecho pulse sequences of the brain and surrounding structures were obtained without intravenous contrast. COMPARISON:  None. FINDINGS: Brain: Ill-defined mass centered within the left sphenoid wing measuring approximately 3.8 x 3.6 x 3.9 cm (AP x ML x CC series 7, image 187 and series 11, image 42). The mass demonstrates increased T2, decreased T1 signal as well as reduced diffusion. The mass invades  the left aspect of clivus (series 554: Image 14), left medial masticator space (554 image 12), inferiorly as far as the left pterygoid plates (295:6). The mass extends into the intracranial compartment without a defined margin with the anterior left temporal lobe likely representing brain invasion. Additionally, there is diffusely decreased fractional anisotropy within the left anterior temporal lobe probably representing both tumor invasion and associated edema. Clear delineation of the mass is suboptimal without intravenous contrast. No other focus of reduced diffusion is present within the brain parenchyma or appreciable wheezing with mass effect on T2 weighted imaging. Scattered nonspecific foci of T2 FLAIR hyperintense signal abnormality in subcortical and periventricular white matter are compatible with mild chronic microvascular ischemic changes for age. Mild brain parenchymal volume loss. No susceptibility hypointensity to indicate intracranial hemorrhage. Vascular: Normal flow voids. Skull and upper cervical spine: Subcentimeter hypointense focus within the clivus (series 2, image 18). Sinuses/Orbits: Negative. Other: Left anterior and posterior upper cervical as well as left-sided submandibular lymphadenopathy. Right upper posterior cervical lymphadenopathy. Addition, there is enlarged right-sided retropharyngeal node at level of nasopharynx (series 550, image 8). IMPRESSION: 1. Ill-defined mass centered within the left sphenoid wing measuring approximately 3.9 cm with invasion of left medial masticator space, left pterygoid plates, and probable invasion of the left anterior temporal lobe. Given the presence of extensive upper cervical adenopathy this likely represents  metastasis. Differential includes extra-axial tumors such is atypical meningioma or hemangiopericytoma. 2. Right greater than left upper cervical lymphadenopathy, likely metastatic. 3. Subcentimeter T1 hypointense focus within the mid clivus  may represent additional metastasis. 4. No additional mass effect within the brain to suggest metastasis. 5. Suboptimal assessment of tumor margins and small metastatic disease without intravenous contrast. Electronically Signed   By: Kristine Garbe M.D.   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  Was on apixaban, last dose was: History of hypertension  Pulmonary problem: Large left pleural effusion w/ associated exertional dyspnea .  Etiology unclear but given what looks to be metastatic Hodgkin's lymphoma suspect this could represent further metastasis. PCXR: Personally reviewed demonstrating a large left  pleural effusion Plan Diagnostic/therapeutic thoracentesis, will send pleural fluid for infection, inflammatory, and cancer evaluation.   Erick Colace ACNP-BC [employer] Pager # [phone] OR # [phone] if no answer  03/10/2017, 11:55 AM  Attending Note:  66 year old female with history of hodgkin's lymphoma who presents with a brain mets.  Patient was transferred to [facility] for neurosurgery to evaluate patient.  Patient complained of SOB and CXR was done that showed left sided pleural effusion.  PCCM was consulted.  On exam, mild respiratory distress.  I reviewed CXR myself, left sided pleural effusion noted.  Discussed with TRH-MD and PCCM-NP.  Pleural effusion:  - Thora today  - Fluid analysis ordered  SOB:  - Due to above.  Unsure if there are any pulmonary involvement.  If symptoms do not improve post thora then will need a CT of the chest.  Hypoxemia:  - Titrate O2 for sat of 88-92%  - May need to consider ambulatory desat study when closer to discharge.  PCCM will f/u on fluid  analysis post thora  Patient seen and examined, agree with above note.  I dictated the care and orders written for this patient under my direction.  Rush Farmer, [place]

## 2017-03-10 NOTE — Consult Note (Signed)
Reason for Consult:New brain mass Referring Physician: Toniann Fail, MD  Helen Douglas is an 66 y.o. female.  HPI: Helen Douglas is a 67 y.o. female with history of chronic kidney disease and chronic anemia with recent diagnosis of Hodgkin's lymphoma and atrial fibrillation was witnessed to have a seizure at home by patient's family at around 10 AM today while patient was sitting on the chair.  As per the patient's son who provided the history patient had a generalized tonic-clonic seizure which lasted for a few minutes and EMS was called and by then patient became confused.  Patient was brought to the ER at [facility].  As per the history by then the patient's seizure is tolerable patient was postictal.  CT of the head done initially without contrast followed by with contrast was done which showed enhancing 25 into 17-16 mm mass in the left middle cranial fossa which appears extra-axial in location.  Adjacent vasogenic not cytotoxic edema.  This lesion cannot be seen on recent MRI.  Differentials include meningioma versus metastatic disease from patient's lymphoma.  Patient was loaded with Keppra 1 g IV.  Chest x-ray done showed left pleural effusion.  EKG was showing normal sinus rhythm.  CBC WBCs 25 hemoglobin is 11.2 platelets are 647 sodium is 135 potassium is 5 BUN 30 creatinine 1.7 AST 40 ALT 17 alk phos 104 calcium 9.5 corrected calcium 10.5.   Patient states over the last 2 weeks patient has been feeling numb on the left side of the face with left temporal headache.  Patient also was not feeling well 3 weeks ago which prompted the workup for lymphoma.  Had biopsy of the right axillary lymph node last week.  Patient follows with oncologist Dr. Gilman Buttner in [place].    Past Medical History:  Diagnosis Date  . Cancer (HCC)   . Dysrhythmia     Past Surgical History:  Procedure Laterality Date  . ABDOMINAL HYSTERECTOMY      Family History  Problem Relation Age of Onset  . CAD Mother    . CAD Father   . CAD Son     Social History:  reports that she quit smoking about 2 years ago. Her smoking use included Cigarettes. She has never used smokeless tobacco. She reports that she does not drink alcohol or use drugs.  Allergies: Not on File  Medications: I have reviewed the patient's current medications.  Results for orders placed or performed during the hospital encounter of 03/09/17 (from the past 48 hour(s))  Comprehensive metabolic panel     Status: Abnormal   Collection Time: 03/09/17 10:11 PM  Result Value Ref Range   Sodium 131 (L) 135 - 145 mmol/L   Potassium 4.4 3.5 - 5.1 mmol/L   Chloride 104 101 - 111 mmol/L   CO2 19 (L) 22 - 32 mmol/L   Glucose, Bld 79 65 - 99 mg/dL   BUN 30 (H) 6 - 20 mg/dL   Creatinine, Ser 8.65 (H) 0.44 - 1.00 mg/dL   Calcium 8.5 (L) 8.9 - 10.3 mg/dL   Total Protein 5.8 (L) 6.5 - 8.1 g/dL   Albumin 1.9 (L) 3.5 - 5.0 g/dL   AST 22 15 - 41 U/L   ALT 7 (L) 14 - 54 U/L   Alkaline Phosphatase 74 38 - 126 U/L   Total Bilirubin 0.8 0.3 - 1.2 mg/dL   GFR calc non Af Amer 29 (L) >60 mL/min   GFR calc Af Amer 33 (L) >60 mL/min  Comment: (NOTE) The eGFR has been calculated using the CKD EPI equation. This calculation has not been validated in all clinical situations. eGFR's persistently <60 mL/min signify possible Chronic Kidney Disease.    Anion gap 8 5 - 15  Magnesium     Status: None   Collection Time: 03/09/17 10:11 PM  Result Value Ref Range   Magnesium 1.9 1.7 - 2.4 mg/dL  Phosphorus     Status: None   Collection Time: 03/09/17 10:11 PM  Result Value Ref Range   Phosphorus 4.2 2.5 - 4.6 mg/dL  CBC WITH DIFFERENTIAL     Status: Abnormal   Collection Time: 03/09/17 10:11 PM  Result Value Ref Range   WBC 21.4 (H) 4.0 - 10.5 K/uL   RBC 4.02 3.87 - 5.11 MIL/uL   Hemoglobin 9.7 (L) 12.0 - 15.0 g/dL   HCT 90.6 (L) 99.1 - 39.2 %   MCV 77.6 (L) 78.0 - 100.0 fL   MCH 24.1 (L) 26.0 - 34.0 pg   MCHC 31.1 30.0 - 36.0 g/dL   RDW 19.2  (H) 68.9 - 15.5 %   Platelets 561 (H) 150 - 400 K/uL   Neutrophils Relative % 86 %   Neutro Abs 18.4 (H) 1.7 - 7.7 K/uL   Lymphocytes Relative 5 %   Lymphs Abs 1.1 0.7 - 4.0 K/uL   Monocytes Relative 8 %   Monocytes Absolute 1.7 (H) 0.1 - 1.0 K/uL   Eosinophils Relative 1 %   Eosinophils Absolute 0.1 0.0 - 0.7 K/uL   Basophils Relative 0 %   Basophils Absolute 0.0 0.0 - 0.1 K/uL    Dg Chest 2 View  Result Date: 03/10/2017 CLINICAL DATA:  Acute onset of shortness of breath. Assess pleural effusion. Initial encounter. EXAM: CHEST  2 VIEW COMPARISON:  None. FINDINGS: A large left-sided pleural effusion is noted, with left basilar airspace opacification. The right lung appears clear. No pneumothorax is seen. The cardiomediastinal silhouette is normal in size. No acute osseous abnormalities are identified. A right-sided chest port is noted ending about the mid SVC. IMPRESSION: Large left-sided pleural effusion, with left basilar airspace opacification. Underlying mass cannot be excluded. Diagnostic thoracentesis could be considered for further evaluation, as deemed clinically appropriate. Electronically Signed   By: Roanna Raider M.D.   On: 03/10/2017 01:35    Review of Systems - Negative except as above    Blood pressure (!) 121/48, pulse 77, temperature 97.6 F (36.4 C), temperature source Oral, resp. rate 18, height 5\' 5"  (1.651 m), weight 206 lb 6.4 oz (93.6 kg), SpO2 95 %. Physical Exam  Constitutional: She is oriented to person, place, and time. She appears well-developed and well-nourished.  HENT:  Head: Normocephalic and atraumatic.  Mouth/Throat: Oropharynx is clear and moist and mucous membranes are normal.  Large, firm, tender palpable lymph nodes left side of neck  Neurological: She is alert and oriented to person, place, and time. She has normal strength and normal reflexes. A cranial nerve deficit and sensory deficit is present. GCS eye subscore is 4. GCS verbal subscore is  5. GCS motor subscore is 6.  Patient has left V 2 and V 3 numbness, including intraoral on left.  She also complains of dull headache, which is relieved by acetominophen    Assessment/Plan: Patient has been recently diagnosed with lymphoma.  There is  A lesion involving the left middle fossa skull base, with intracranial spread.  This appears to be a brain reaction, rather primary CNS lymphoma.  This  is rapidly progressive, given near normal MRI 2 weeks ago with significant change on current scan.  She has V 2 and V 3 numbness related to left skull base involvement.  She needs aggressive staging/intervention from medical oncology, decadron (30m Q 6 hours) for intracranial mass effect and keppra for presumed seizure, but no neurosurgical intervention at this time. Gabapentin can be used if facial numbness becomes painful or she develops related dysesthesias (300 mg TID).    SPeggyann Shoals MD 03/10/2017, 2:32 AM

## 2017-03-10 NOTE — Progress Notes (Signed)
Patient asymptomatic with HR 170-180.  BP 123/65  She does not feel her heart racing.  12 lead EKG done,  RAF.  5mg  Lopressor given IV.  Converted to SR 80-90.  Rn to call if assistance needed.

## 2017-03-10 NOTE — Progress Notes (Signed)
Initial Nutrition Assessment  DOCUMENTATION CODES:   Obesity unspecified  INTERVENTION:   -Glucerna Shake po TID, each supplement provides 220 kcal and 10 grams of protein  NUTRITION DIAGNOSIS:   Increased nutrient needs related to cancer and cancer related treatments as evidenced by estimated needs.  GOAL:   Patient will meet greater than or equal to 90% of their needs  MONITOR:   PO intake, Supplement acceptance, Labs, Weight trends, Skin, I & O's  REASON FOR ASSESSMENT:   Malnutrition Screening Tool    ASSESSMENT:   Helen Douglas is a 66 y.o. female with history of chronic kidney disease and chronic anemia with recent diagnosis of Hodgkin's lymphoma and atrial fibrillation was witnessed to have a seizure at home by patient's family at around 57 AM today while patient was sitting on the chair.  As per the patient's son who provided the history patient had a generalized tonic-clonic seizure which lasted for a few minutes and EMS was called and by then patient became confused.  Pt admitted with brain mass with seizure.   10/22- s/p lt thoracentesis for lt pleural effusion, 1100 ml fluid obtained, samples sent to pathology  Pt either down for testing or in with MD at times of visits. Unable to obtain further nutrition hx or complete nutrition-focused physical exam at this time.  Pt with increased nutrient needs related to recent cancer diagnosis. Per chart review, recommending aggressive treatment.   Labs reviewed: Na: 134.   Diet Order:  Diet Heart Room service appropriate? Yes; Fluid consistency: Thin  Skin:   (closed rt chest incision, closed rt axilla incision)  Last BM:  03/09/17  Height:   Ht Readings from Last 1 Encounters:  03/09/17 5\' 5"  (1.651 m)    Weight:   Wt Readings from Last 1 Encounters:  03/09/17 206 lb 6.4 oz (93.6 kg)    Ideal Body Weight:  56.8 kg  BMI:  Body mass index is 34.35 kg/m.  Estimated Nutritional Needs:   Kcal:   1700-1900  Protein:  100-115 grams  Fluid:  1.7-1.9 L  EDUCATION NEEDS:   Education needs no appropriate at this time  Kasidi Shanker A. Jimmye Norman, RD, LDN, CDE Pager: [phone] After hours Pager: [phone]

## 2017-03-11 DIAGNOSIS — C8338 Diffuse large B-cell lymphoma, lymph nodes of multiple sites: Secondary | ICD-10-CM

## 2017-03-11 DIAGNOSIS — C8330 Diffuse large B-cell lymphoma, unspecified site: Secondary | ICD-10-CM

## 2017-03-11 LAB — BASIC METABOLIC PANEL
ANION GAP: 9 (ref 5–15)
BUN: 31 mg/dL — ABNORMAL HIGH (ref 6–20)
CALCIUM: 9.2 mg/dL (ref 8.9–10.3)
CHLORIDE: 105 mmol/L (ref 101–111)
CO2: 21 mmol/L — AB (ref 22–32)
Creatinine, Ser: 1.54 mg/dL — ABNORMAL HIGH (ref 0.44–1.00)
GFR calc non Af Amer: 34 mL/min — ABNORMAL LOW (ref >60)
GFR, EST AFRICAN AMERICAN: 39 mL/min — AB (ref >60)
GLUCOSE: 164 mg/dL — AB (ref 65–99)
POTASSIUM: 5.2 mmol/L — AB (ref 3.5–5.1)
Sodium: 135 mmol/L (ref 135–145)

## 2017-03-11 LAB — CBC
HEMATOCRIT: 31.7 % — AB (ref 36.0–46.0)
HEMOGLOBIN: 9.9 g/dL — AB (ref 12.0–15.0)
MCH: 24.1 pg — ABNORMAL LOW (ref 26.0–34.0)
MCHC: 31.2 g/dL (ref 30.0–36.0)
MCV: 77.1 fL — AB (ref 78.0–100.0)
Platelets: 619 10*3/uL — ABNORMAL HIGH (ref 150–400)
RBC: 4.11 MIL/uL (ref 3.87–5.11)
RDW: 17.9 % — AB (ref 11.5–15.5)
WBC: 18.4 10*3/uL — AB (ref 4.0–10.5)

## 2017-03-11 MED ORDER — METOPROLOL TARTRATE 25 MG PO TABS: 25.0000 mg | ORAL_TABLET | Freq: Two times a day (BID) | ORAL | 0 refills | Status: AC

## 2017-03-11 MED ORDER — DEXAMETHASONE 4 MG PO TABS: 4.0000 mg | ORAL_TABLET | Freq: Four times a day (QID) | ORAL | 0 refills | Status: AC

## 2017-03-11 MED ORDER — LEVETIRACETAM 500 MG PO TABS: 500.0000 mg | ORAL_TABLET | Freq: Two times a day (BID) | ORAL | 0 refills | Status: AC

## 2017-03-11 NOTE — Progress Notes (Signed)
Appointment - [facility] is scheduled for 03/12/2017 at 2 PM with Dr. Darliss Ridgel. MD made aware.

## 2017-03-11 NOTE — Progress Notes (Signed)
[facility] Attending Note  ADMISSION DATE:  03/09/2017  CONSULTATION DATE: 03/10/2017  REFERRING MD: Eulogio Bear, D.O. / TRH  CHIEF COMPLAINT: Left pleural effusion  Presenting HPI:  66 y.o. female with known history of atrial fibrillation, chronic renal failure, and chronic anemia. Patient newly diagnosed with hodgkin's lymphoma followed by Dr. Hinton Rao at [facility]. Patient transferred to our facility from [facility] after the finding of an intracranial mass. Patient was also found to have a large left pleural effusion which prompted pulmonary consultation.  Subjective:  Patient reports no new headache. Continues to feel numbness across her left face and parietal scalp. Reports some minimal left-sided chest discomfort with deep inspiration that has significant improved post thoracentesis. Dyspnea has also improved post thoracentesis. Minimal to no cough.   Review of Systems:  Denies any subjective fever, chills, or sweats. No abdominal pain or nausea.  Temp:  [97.6 F (36.4 C)-97.8 F (36.6 C)] 97.7 F (36.5 C) (10/23 0514) Pulse Rate:  [62-170] 62 (10/23 0514) Resp:  [16-18] 18 (10/23 0514) BP: (105-123)/(45-69) 116/50 (10/23 0514) SpO2:  [94 %-98 %] 95 % (10/23 0514)   General:  Awake. No distress. Alert. Obese. Integument:  Warm & dry. No rash or bruising on exposed skin. HEENT:  No scleral icterus or injection. Pupils symmetric. Tacky mucous membranes. Pulmonary:  Clear bilaterally to auscultation. No accessory muscle use on room air. Good aeration bilaterally.  Cardiovascular:  Regular rate. No JVD apprecaited. No edema. Abdomen:  Soft. Nontender. Protuberant. Normal bowel sounds. Neurological:  Symmetric face & tongue protrusion. No meningismus. Moving all 4 extremities equally. Oriented x4.   CBC Latest Ref Rng & Units 03/10/2017 03/09/2017  WBC 4.0 - 10.5 K/uL 22.0(H) 21.4(H)  Hemoglobin 12.0 - 15.0 g/dL 9.5(L) 9.7(L)  Hematocrit 36.0 -  46.0 % 31.2(L) 31.2(L)  Platelets 150 - 400 K/uL 592(H) 561(H)   BMP Latest Ref Rng & Units 03/10/2017 03/09/2017  Glucose 65 - 99 mg/dL 78 79  BUN 6 - 20 mg/dL 29(H) 30(H)  Creatinine 0.44 - 1.00 mg/dL 1.77(H) 1.77(H)  Sodium 135 - 145 mmol/L 134(L) 131(L)  Potassium 3.5 - 5.1 mmol/L 4.3 4.4  Chloride 101 - 111 mmol/L 102 104  CO2 22 - 32 mmol/L 18(L) 19(L)  Calcium 8.9 - 10.3 mg/dL 8.5(L) 8.5(L)    IMAGING/STUDIES: CT CHEST/ABD/PELVIS W/O Mar 09, 2017:  Personally reviewed by me. Small to moderate size left pleural effusion with adjacent atelectasis. 2 mm right upper lobe nodule. Enlarging mass within the pancreatic tail and splenic hilum also noted. Enlargement of pericardial phrenic adenopathy as well as periaortic adenopathy. Left axillary lymph nodes are also notable. Stable 1.6 cm right ovarian cyst as well as probable cyst within upper pole of right kidney measuring 1 cm. MRI BRAIN W/O 10/22: IMPRESSION: 1. Ill-defined mass centered within the left sphenoid wing measuring approximately 3.9 cm with invasion of left medial masticator space, left pterygoid plates, and probable invasion of the left anterior temporal lobe. Given the presence of extensive upper cervical adenopathy this likely represents metastasis. Differential includes extra-axial tumors such is atypical meningioma or hemangiopericytoma. 2. Right greater than left upper cervical lymphadenopathy, likely metastatic. 3. Subcentimeter T1 hypointense focus within the mid clivus may represent additional metastasis. 4. No additional mass effect within the brain to suggest metastasis. 5. Suboptimal assessment of tumor margins and small metastatic disease without intravenous contrast. PORT CXR 10/22 (post thoracentesis):  Reviewed by me. Lordotic view. I do not appreciate any residual pleural effusion on the  left. Elevation left hemidiaphragm new since July but unchanged compared with x-ray imaging earlier this month. Mediastinal and  heart contour is normal. LEFT THORACENTESIS 10/22 >>>  1.1 L cloudy fluid. Cytology & Flow pending. WBC 1315 (73% lymph, 23% neutro, 4% monocytes).   MICROBIOLOGY: Left Pleural Fluid Culture 10/22 >>>  ANTIBIOTICS: None.   ASSESSMENT/PLAN:  66 y.o. female with newly diagnosed Hodgkin's lymphoma presenting with left pleural effusion and intracranial mass. Pleural fluid cell count differential suspicious for lymphoma. Additional pleural fluid analysis not performed and not available for my review. Doubtful this represents an infectious process.  1. Left pleural effusion: Awaiting cytology and flow cytometry. Awaiting finalization of pleural fluid culture. 2. Elevated left hemidiaphragm: Suspect secondary to phrenic nerve involvement suggested on previous CT scan of the chest.  I have spent a total of 36 minutes of time today caring for the patient, reviewing the patient's electronic medical record, and with more than 50% of that time spent coordinating care with the patient as well as reviewing the continuing plan of care with the patient at bedside.  Remainder of care as per primary service and other consultants.  Sonia Baller Ashok Cordia, M.D. [facility] Pager:  [phone] After 7pm or if no response, call [phone] 9:07 AM 03/11/17

## 2017-03-11 NOTE — Progress Notes (Signed)
Patient was very talkative. Her mind is filled with many things. She is concerned about what is wrong and wants to know what is happening. Doctor came in while we visited to say he will let her know more info as it is available.  She is also very stressed about relationships with family members like her 50 had quadruple bypass and situations within family-various issues.  Focused on helping patient to let go of the large load of stress she is carrying. It is not helping her to hold on to all of that. It is good for her to express things on her mind and get them out instead of letting them build up inside her.  Will benefit from more pastoral care visits  Especially as she waits to get more info from tests that have been done. Conard Novak, Chaplain    03/11/17 1000  Clinical Encounter Type  Visited With Patient  Visit Type Initial;Spiritual support  Referral From Nurse  Spiritual Encounters  Spiritual Needs Sacred text;Prayer  Stress Factors  Patient Stress Factors Family relationships;Health changes;Major life changes  Family Stress Factors Family relationships

## 2017-03-11 NOTE — Discharge Summary (Signed)
Physician Discharge Summary  Helen Douglas ZDG:[id number] DOB: [birth year] DOA: 03/09/2017  PCP: No primary care provider on file.  Admit date: 03/09/2017 Discharge date: 03/11/2017   Recommendations for Outpatient Follow-Up:   1. Thoracentesis labs pending- cytology/cytometry 2. Has oncology follow up on Wednesday-- path from 10/18 LN biopsy shows B cell lymphoma 3. Decadron per oncology 4. keppra   Discharge Diagnosis:   Principal Problem:   Brain mass Active Problems:   CKD (chronic kidney disease) stage 3, GFR 30-59 ml/min (HCC)   Essential hypertension   Atrial fibrillation (HCC)   Lymphoma (HCC)   Seizures (HCC)   Pleural effusion   Diffuse large B cell lymphoma ([place])   Discharge disposition:  Home:  Discharge Condition: Improved.  Diet recommendation: Low sodium, heart healthy.  Carbohydrate-modified  Wound care: None.   History of Present Illness:   Helen Douglas is a 66 y.o. female with history of chronic kidney disease and chronic anemia with recent diagnosis of Hodgkin's lymphoma and atrial fibrillation was witnessed to have a seizure at home by patient's family at around 21 AM today while patient was sitting on the chair.  As per the patient's son who provided the history patient had a generalized tonic-clonic seizure which lasted for a few minutes and EMS was called and by then patient became confused.  Patient was brought to the ER at [facility].  As per the history by then the patient's seizure is tolerable patient was postictal.  CT of the head done initially without contrast followed by with contrast was done which showed enhancing 25 into 17-16 mm mass in the left middle cranial fossa which appears extra-axial in location.  Adjacent vasogenic not cytotoxic edema.  This lesion cannot be seen on recent MRI.  Differentials include meningioma versus metastatic disease from patient's lymphoma.  Patient was loaded with Keppra 1 g IV.  Chest x-ray done  showed left pleural effusion.  EKG was showing normal sinus rhythm.  CBC WBCs 25 hemoglobin is 11.2 platelets are 647 sodium is 135 potassium is 5 BUN 30 creatinine 1.7 AST 40 ALT 17 alk phos 104 calcium 9.5 corrected calcium 10.5.   Patient states over the last 2 weeks patient has been feeling numb on the left side of the face with left temporal headache.  Patient also was not feeling well 3 weeks ago which prompted the workup for lymphoma.  Had biopsy of the right axillary lymph node last week.  Patient follows with oncologist Dr. Hinton Rao in [place].   Hospital Course by Problem:   Brain mass with seizure - appears per NS to be a brain reactions, not primary lesion -keppra -decadron -no surgical intervention -needs aggressive staging/intervention by oncology-- has oncology at [facility] with Dr. Hinton Rao: Has follow up on Wednesday  Left sided facial pain -gabapentin if pain worsens  Paroxysmal atrial fibrillation -resume home meds  Chronic kidney disease stage III  -follow  Chronic anemia probably from chronic kidney disease - follow CBC.  Diffuse large B cell lymphoma -LN biopsy resulted in above  Leukocytosis - likely from lymphoma.  Left pleural effusion per the chest x-ray done at [facility]  -s/p thoracentesis -much improved  Hypertension  -resume metoprolol.  Run of a fib with RVR -holding eliquis -resume BB -converted back to sinus with IV BB     Medical Consultants:    NS.   Discharge Exam:   Vitals:   03/10/17 2108 03/11/17 0514  BP: (!) 105/48 (!) 116/50  Pulse: 74 62  Resp: 16 18  Temp: 97.6 F (36.4 C) 97.7 F (36.5 C)  SpO2: 95% 95%   Vitals:   03/10/17 0920 03/10/17 1505 03/10/17 2108 03/11/17 0514  BP: 123/65 (!) 106/45 (!) 105/48 (!) 116/50  Pulse: 80 73 74 62  Resp:  _0 Temp:  97.8 F (36.6 C) 97.6 F (36.4 C) 97.7 F (36.5 C)  TempSrc:   Oral Oral  SpO2:  94% 95% 95%  Weight:      Height:         Gen:  NAD   The results of significant diagnostics from this hospitalization (including imaging, microbiology, ancillary and laboratory) are listed below for reference.     Procedures and Diagnostic Studies:   Dg Chest 2 View  Result Date: 03/10/2017 CLINICAL DATA:  Acute onset of shortness of breath. Assess pleural effusion. Initial encounter. EXAM: CHEST  2 VIEW COMPARISON:  None. FINDINGS: A large left-sided pleural effusion is noted, with left basilar airspace opacification. The right lung appears clear. No pneumothorax is seen. The cardiomediastinal silhouette is normal in size. No acute osseous abnormalities are identified. A right-sided chest port is noted ending about the mid SVC. IMPRESSION: Large left-sided pleural effusion, with left basilar airspace opacification. Underlying mass cannot be excluded. Diagnostic thoracentesis could be considered for further evaluation, as deemed clinically appropriate. Electronically Signed   By: Garald Balding M.D.   On: 03/10/2017 01:35   Mr Brain Wo Contrast  Result Date: 03/10/2017 CLINICAL DATA:  66 y/o  F; 66 y/o  F; brain mass. EXAM: MRI HEAD WITHOUT CONTRAST TECHNIQUE: Multiplanar, multiecho pulse sequences of the brain and surrounding structures were obtained without intravenous contrast. COMPARISON:  None. FINDINGS: Brain: Ill-defined mass centered within the left sphenoid wing measuring approximately 3.8 x 3.6 x 3.9 cm (AP x ML x CC series 7, image 187 and series 11, image 42). The mass demonstrates increased T2, decreased T1 signal as well as reduced diffusion. The mass invades the left aspect of clivus (series 554: Image 14), left medial masticator space (554 image 12), inferiorly as far as the left pterygoid plates (539:7). The mass extends into the intracranial compartment without a defined margin with the anterior left temporal lobe likely representing brain invasion. Additionally, there is diffusely decreased fractional anisotropy  within the left anterior temporal lobe probably representing both tumor invasion and associated edema. Clear delineation of the mass is suboptimal without intravenous contrast. No other focus of reduced diffusion is present within the brain parenchyma or appreciable wheezing with mass effect on T2 weighted imaging. Scattered nonspecific foci of T2 FLAIR hyperintense signal abnormality in subcortical and periventricular white matter are compatible with mild chronic microvascular ischemic changes for age. Mild brain parenchymal volume loss. No susceptibility hypointensity to indicate intracranial hemorrhage. Vascular: Normal flow voids. Skull and upper cervical spine: Subcentimeter hypointense focus within the clivus (series 2, image 18). Sinuses/Orbits: Negative. Other: Left anterior and posterior upper cervical as well as left-sided submandibular lymphadenopathy. Right upper posterior cervical lymphadenopathy. Addition, there is enlarged right-sided retropharyngeal node at level of nasopharynx (series 550, image 8). IMPRESSION: 1. Ill-defined mass centered within the left sphenoid wing measuring approximately 3.9 cm with invasion of left medial masticator space, left pterygoid plates, and probable invasion of the left anterior temporal lobe. Given the presence of extensive upper cervical adenopathy this likely represents metastasis. Differential includes extra-axial tumors such is atypical meningioma or hemangiopericytoma. 2. Right greater than left upper cervical lymphadenopathy, likely metastatic.  3. Subcentimeter T1 hypointense focus within the mid clivus may represent additional metastasis. 4. No additional mass effect within the brain to suggest metastasis. 5. Suboptimal assessment of tumor margins and small metastatic disease without intravenous contrast. Electronically Signed   By: Kristine Garbe M.D.   On: 03/10/2017 06:55   Dg Chest Port 1 View  Result Date: 03/10/2017 CLINICAL DATA:  Pleural  effusion and shortness of breath. Status post thoracentesis. EXAM: PORTABLE CHEST 1 VIEW COMPARISON:  03/09/2017 FINDINGS: 1339 hours. Interval decrease in left pleural effusion, now small in size. There is some apparent atelectasis at the left base. No pneumothorax. Right Port-A-Cath is again noted with distal tip positioned over the mid SVC. Right lung clear. The cardiopericardial silhouette is within normal limits for size. The visualized bony structures of the thorax are intact. Telemetry leads overlie the chest. IMPRESSION: Interval decrease left pleural effusion without pneumothorax. Electronically Signed   By: Misty Stanley M.D.   On: 03/10/2017 14:00     Labs:   Basic Metabolic Panel:  Recent Labs Lab 03/09/17 2211 03/10/17 0706 03/11/17 0838  NA 131* 134* 135  K 4.4 4.3 5.2*  CL 104 102 105  CO2 19* 18* 21*  GLUCOSE 79 78 164*  BUN 30* 29* 31*  CREATININE 1.77* 1.77* 1.54*  CALCIUM 8.5* 8.5* 9.2  MG 1.9  --   --   PHOS 4.2  --   --    GFR Estimated Creatinine Clearance: 40.6 mL/min (A) (by C-G formula based on SCr of 1.54 mg/dL (H)). Liver Function Tests:  Recent Labs Lab 03/09/17 2211 03/10/17 1401  AST 22  --   ALT 7*  --   ALKPHOS 74  --   BILITOT 0.8  --   PROT 5.8* 6.0*  ALBUMIN 1.9*  --    No results for input(s): LIPASE, AMYLASE in the last 168 hours. No results for input(s): AMMONIA in the last 168 hours. Coagulation profile  Recent Labs Lab 03/10/17 1030  INR 1.34    CBC:  Recent Labs Lab 03/09/17 2211 03/10/17 0706 03/11/17 0838  WBC 21.4* 22.0* 18.4*  NEUTROABS 18.4*  --   --   HGB 9.7* 9.5* 9.9*  HCT 31.2* 31.2* 31.7*  MCV 77.6* 78.6 77.1*  PLT 561* 592* 619*   Cardiac Enzymes: No results for input(s): CKTOTAL, CKMB, CKMBINDEX, TROPONINI in the last 168 hours. BNP: Invalid input(s): POCBNP CBG: No results for input(s): GLUCAP in the last 168 hours. D-Dimer No results for input(s): DDIMER in the last 72 hours. Hgb A1c No  results for input(s): HGBA1C in the last 72 hours. Lipid Profile  Recent Labs  03/10/17 1401  CHOL 115   Thyroid function studies No results for input(s): TSH, T4TOTAL, T3FREE, THYROIDAB in the last 72 hours.  Invalid input(s): FREET3 Anemia work up No results for input(s): VITAMINB12, FOLATE, FERRITIN, TIBC, IRON, RETICCTPCT in the last 72 hours. Microbiology Recent Results (from the past 240 hour(s))  Body fluid culture     Status: None (Preliminary result)   Collection Time: 03/10/17  1:48 PM  Result Value Ref Range Status   Specimen Description FLUID LEFT PLEURAL  Final   Special Requests NONE  Final   Gram Stain   Final    MODERATE WBC PRESENT,BOTH PMN AND MONONUCLEAR NO ORGANISMS SEEN    Culture NO GROWTH < 24 HOURS  Final   Report Status PENDING  Incomplete     Discharge Instructions:   Discharge Instructions    Diet -  low sodium heart healthy    Complete by:  As directed    Diet Carb Modified    Complete by:  As directed    Discharge instructions    Complete by:  As directed    Blood sugars may elevate while on decadron- watch diet   Increase activity slowly    Complete by:  As directed      Allergies as of 03/11/2017      Reactions   Penicillin G Anaphylaxis      Medication List    TAKE these medications   dexamethasone 4 MG tablet Commonly known as:  DECADRON Take 1 tablet (4 mg total) by mouth every 6 (six) hours.   ELIQUIS 5 MG Tabs tablet Generic drug:  apixaban Take 5 mg by mouth 2 (two) times daily.   FLUoxetine 40 MG capsule Commonly known as:  PROZAC Take 40 mg by mouth daily.   levETIRAcetam 500 MG tablet Commonly known as:  KEPPRA Take 1 tablet (500 mg total) by mouth 2 (two) times daily.   metoprolol tartrate 25 MG tablet Commonly known as:  LOPRESSOR Take 1 tablet (25 mg total) by mouth 2 (two) times daily. What changed:  how much to take   ondansetron 4 MG disintegrating tablet Commonly known as:  ZOFRAN-ODT Take 4 mg by  mouth every 8 (eight) hours as needed for nausea or vomiting.   oxyCODONE-acetaminophen 5-325 MG tablet Commonly known as:  PERCOCET/ROXICET Take 1 tablet by mouth every 4 (four) hours as needed for moderate pain.      Follow-up Information    Derwood Kaplan, MD Follow up.   Specialty:  Oncology Why:  10/24 at [facility] information: [address]. [place] [address] [phone]            Time coordinating discharge: 35 min  Signed:  Elba Dendinger U Kyrel Leighton   [facility] 03/11/2017, 4:14 PM

## 2017-03-11 NOTE — Progress Notes (Signed)
Aram Beecham to be D/C'd Home per MD order.  Discussed with the patient and all questions fully answered.  VSS, IV catheter discontinued intact. Site without signs and symptoms of complications. Dressing and pressure applied.  An After Visit Summary was printed and given to the patient. Patient received prescription.  D/c education completed with patient/family including follow up instructions, medication list, d/c activities limitations if indicated, with other d/c instructions as indicated by MD - patient able to verbalize understanding, all questions fully answered.   Patient instructed to return to ED, call 911, or call MD for any changes in condition.   Patient escorted via [facility], and D/C'd home via private auto.  Christoper Fabian Everett Ehrler 03/11/2017 12:53 PM

## 2017-03-12 DIAGNOSIS — C8338 Diffuse large B-cell lymphoma, lymph nodes of multiple sites: Secondary | ICD-10-CM | POA: Diagnosis not present

## 2017-03-12 DIAGNOSIS — E875 Hyperkalemia: Secondary | ICD-10-CM | POA: Diagnosis not present

## 2017-03-12 DIAGNOSIS — D72829 Elevated white blood cell count, unspecified: Secondary | ICD-10-CM | POA: Diagnosis not present

## 2017-03-12 DIAGNOSIS — J918 Pleural effusion in other conditions classified elsewhere: Secondary | ICD-10-CM | POA: Diagnosis not present

## 2017-03-12 DIAGNOSIS — D649 Anemia, unspecified: Secondary | ICD-10-CM | POA: Diagnosis not present

## 2017-03-12 DIAGNOSIS — R569 Unspecified convulsions: Secondary | ICD-10-CM | POA: Diagnosis not present

## 2017-03-12 DIAGNOSIS — E860 Dehydration: Secondary | ICD-10-CM | POA: Diagnosis not present

## 2017-03-12 DIAGNOSIS — N189 Chronic kidney disease, unspecified: Secondary | ICD-10-CM | POA: Diagnosis not present

## 2017-03-12 DIAGNOSIS — D473 Essential (hemorrhagic) thrombocythemia: Secondary | ICD-10-CM | POA: Diagnosis not present

## 2017-03-12 LAB — PH, BODY FLUID: pH, Body Fluid: 8

## 2017-03-13 LAB — BODY FLUID CULTURE: Culture: NO GROWTH

## 2017-03-13 LAB — MISC LABCORP TEST (SEND OUT): Labcorp test code: 9985

## 2017-03-13 LAB — CHOLESTEROL, BODY FLUID: Cholesterol, Fluid: 59 mg/dL

## 2017-03-27 DIAGNOSIS — C8338 Diffuse large B-cell lymphoma, lymph nodes of multiple sites: Secondary | ICD-10-CM | POA: Diagnosis not present

## 2017-03-31 ENCOUNTER — Encounter (HOSPITAL_COMMUNITY): Payer: Self-pay

## 2017-04-02 LAB — TISSUE HYBRIDIZATION TO NCBH

## 2017-04-13 DIAGNOSIS — I4891 Unspecified atrial fibrillation: Secondary | ICD-10-CM

## 2017-04-13 DIAGNOSIS — C8338 Diffuse large B-cell lymphoma, lymph nodes of multiple sites: Secondary | ICD-10-CM | POA: Diagnosis not present

## 2017-04-13 DIAGNOSIS — J90 Pleural effusion, not elsewhere classified: Secondary | ICD-10-CM

## 2017-04-13 DIAGNOSIS — B372 Candidiasis of skin and nail: Secondary | ICD-10-CM

## 2017-04-13 DIAGNOSIS — D61818 Other pancytopenia: Secondary | ICD-10-CM | POA: Diagnosis not present

## 2017-04-13 DIAGNOSIS — R0902 Hypoxemia: Secondary | ICD-10-CM | POA: Diagnosis not present

## 2017-04-14 DIAGNOSIS — J918 Pleural effusion in other conditions classified elsewhere: Secondary | ICD-10-CM

## 2017-04-14 DIAGNOSIS — J90 Pleural effusion, not elsewhere classified: Secondary | ICD-10-CM | POA: Diagnosis not present

## 2017-04-14 DIAGNOSIS — R161 Splenomegaly, not elsewhere classified: Secondary | ICD-10-CM | POA: Diagnosis not present

## 2017-04-14 DIAGNOSIS — R1030 Lower abdominal pain, unspecified: Secondary | ICD-10-CM

## 2017-04-14 DIAGNOSIS — E46 Unspecified protein-calorie malnutrition: Secondary | ICD-10-CM | POA: Diagnosis not present

## 2017-04-14 DIAGNOSIS — D61810 Antineoplastic chemotherapy induced pancytopenia: Secondary | ICD-10-CM | POA: Diagnosis not present

## 2017-04-14 DIAGNOSIS — B372 Candidiasis of skin and nail: Secondary | ICD-10-CM | POA: Diagnosis not present

## 2017-04-14 DIAGNOSIS — E871 Hypo-osmolality and hyponatremia: Secondary | ICD-10-CM | POA: Diagnosis not present

## 2017-04-14 DIAGNOSIS — C8338 Diffuse large B-cell lymphoma, lymph nodes of multiple sites: Secondary | ICD-10-CM

## 2017-04-14 DIAGNOSIS — D61818 Other pancytopenia: Secondary | ICD-10-CM | POA: Diagnosis not present

## 2017-04-14 DIAGNOSIS — R0902 Hypoxemia: Secondary | ICD-10-CM | POA: Diagnosis not present

## 2017-04-14 DIAGNOSIS — I4891 Unspecified atrial fibrillation: Secondary | ICD-10-CM | POA: Diagnosis not present

## 2017-04-14 DIAGNOSIS — R531 Weakness: Secondary | ICD-10-CM

## 2017-04-15 DIAGNOSIS — B379 Candidiasis, unspecified: Secondary | ICD-10-CM | POA: Diagnosis not present

## 2017-04-15 DIAGNOSIS — I4891 Unspecified atrial fibrillation: Secondary | ICD-10-CM | POA: Diagnosis not present

## 2017-04-15 DIAGNOSIS — J90 Pleural effusion, not elsewhere classified: Secondary | ICD-10-CM | POA: Diagnosis not present

## 2017-04-15 DIAGNOSIS — E8770 Fluid overload, unspecified: Secondary | ICD-10-CM

## 2017-04-15 DIAGNOSIS — D61810 Antineoplastic chemotherapy induced pancytopenia: Secondary | ICD-10-CM | POA: Diagnosis not present

## 2017-04-15 DIAGNOSIS — A4150 Gram-negative sepsis, unspecified: Secondary | ICD-10-CM | POA: Diagnosis not present

## 2017-04-15 DIAGNOSIS — N390 Urinary tract infection, site not specified: Secondary | ICD-10-CM | POA: Diagnosis not present

## 2017-04-15 DIAGNOSIS — E871 Hypo-osmolality and hyponatremia: Secondary | ICD-10-CM | POA: Diagnosis not present

## 2017-04-15 DIAGNOSIS — C8338 Diffuse large B-cell lymphoma, lymph nodes of multiple sites: Secondary | ICD-10-CM

## 2017-04-15 DIAGNOSIS — D61818 Other pancytopenia: Secondary | ICD-10-CM | POA: Diagnosis not present

## 2017-04-15 DIAGNOSIS — E46 Unspecified protein-calorie malnutrition: Secondary | ICD-10-CM

## 2017-04-15 DIAGNOSIS — B372 Candidiasis of skin and nail: Secondary | ICD-10-CM | POA: Diagnosis not present

## 2017-04-16 DIAGNOSIS — N390 Urinary tract infection, site not specified: Secondary | ICD-10-CM | POA: Diagnosis not present

## 2017-04-16 DIAGNOSIS — D61810 Antineoplastic chemotherapy induced pancytopenia: Secondary | ICD-10-CM

## 2017-04-16 DIAGNOSIS — E8770 Fluid overload, unspecified: Secondary | ICD-10-CM

## 2017-04-16 DIAGNOSIS — E871 Hypo-osmolality and hyponatremia: Secondary | ICD-10-CM | POA: Diagnosis not present

## 2017-04-16 DIAGNOSIS — B379 Candidiasis, unspecified: Secondary | ICD-10-CM | POA: Diagnosis not present

## 2017-04-16 DIAGNOSIS — I4891 Unspecified atrial fibrillation: Secondary | ICD-10-CM | POA: Diagnosis not present

## 2017-04-16 DIAGNOSIS — C8338 Diffuse large B-cell lymphoma, lymph nodes of multiple sites: Secondary | ICD-10-CM

## 2017-04-16 DIAGNOSIS — B372 Candidiasis of skin and nail: Secondary | ICD-10-CM | POA: Diagnosis not present

## 2017-04-16 DIAGNOSIS — B961 Klebsiella pneumoniae [K. pneumoniae] as the cause of diseases classified elsewhere: Secondary | ICD-10-CM | POA: Diagnosis not present

## 2017-04-16 DIAGNOSIS — E46 Unspecified protein-calorie malnutrition: Secondary | ICD-10-CM | POA: Diagnosis not present

## 2017-04-16 DIAGNOSIS — D61818 Other pancytopenia: Secondary | ICD-10-CM | POA: Diagnosis not present

## 2017-04-16 DIAGNOSIS — J90 Pleural effusion, not elsewhere classified: Secondary | ICD-10-CM | POA: Diagnosis not present

## 2017-04-17 DIAGNOSIS — B372 Candidiasis of skin and nail: Secondary | ICD-10-CM | POA: Diagnosis not present

## 2017-04-17 DIAGNOSIS — J90 Pleural effusion, not elsewhere classified: Secondary | ICD-10-CM | POA: Diagnosis not present

## 2017-04-17 DIAGNOSIS — I4891 Unspecified atrial fibrillation: Secondary | ICD-10-CM | POA: Diagnosis not present

## 2017-04-17 DIAGNOSIS — D61818 Other pancytopenia: Secondary | ICD-10-CM | POA: Diagnosis not present

## 2017-04-18 DIAGNOSIS — E871 Hypo-osmolality and hyponatremia: Secondary | ICD-10-CM | POA: Diagnosis not present

## 2017-04-18 DIAGNOSIS — D61818 Other pancytopenia: Secondary | ICD-10-CM | POA: Diagnosis not present

## 2017-04-18 DIAGNOSIS — I4891 Unspecified atrial fibrillation: Secondary | ICD-10-CM | POA: Diagnosis not present

## 2017-04-18 DIAGNOSIS — N390 Urinary tract infection, site not specified: Secondary | ICD-10-CM | POA: Diagnosis not present

## 2017-04-18 DIAGNOSIS — E46 Unspecified protein-calorie malnutrition: Secondary | ICD-10-CM

## 2017-04-18 DIAGNOSIS — E43 Unspecified severe protein-calorie malnutrition: Secondary | ICD-10-CM | POA: Diagnosis not present

## 2017-04-18 DIAGNOSIS — J90 Pleural effusion, not elsewhere classified: Secondary | ICD-10-CM | POA: Diagnosis not present

## 2017-04-18 DIAGNOSIS — D61810 Antineoplastic chemotherapy induced pancytopenia: Secondary | ICD-10-CM

## 2017-04-18 DIAGNOSIS — B379 Candidiasis, unspecified: Secondary | ICD-10-CM | POA: Diagnosis not present

## 2017-04-18 DIAGNOSIS — Z9889 Other specified postprocedural states: Secondary | ICD-10-CM | POA: Diagnosis not present

## 2017-04-18 DIAGNOSIS — A414 Sepsis due to anaerobes: Secondary | ICD-10-CM

## 2017-04-18 DIAGNOSIS — E8770 Fluid overload, unspecified: Secondary | ICD-10-CM

## 2017-04-18 DIAGNOSIS — C8338 Diffuse large B-cell lymphoma, lymph nodes of multiple sites: Secondary | ICD-10-CM

## 2017-04-18 DIAGNOSIS — D649 Anemia, unspecified: Secondary | ICD-10-CM | POA: Diagnosis not present

## 2017-04-18 DIAGNOSIS — R0902 Hypoxemia: Secondary | ICD-10-CM | POA: Diagnosis not present

## 2017-04-18 DIAGNOSIS — B961 Klebsiella pneumoniae [K. pneumoniae] as the cause of diseases classified elsewhere: Secondary | ICD-10-CM | POA: Diagnosis not present

## 2017-04-19 DIAGNOSIS — I4891 Unspecified atrial fibrillation: Secondary | ICD-10-CM | POA: Diagnosis not present

## 2017-04-19 DIAGNOSIS — E43 Unspecified severe protein-calorie malnutrition: Secondary | ICD-10-CM | POA: Diagnosis not present

## 2017-04-19 DIAGNOSIS — A414 Sepsis due to anaerobes: Secondary | ICD-10-CM | POA: Diagnosis not present

## 2017-05-23 DIAGNOSIS — E46 Unspecified protein-calorie malnutrition: Secondary | ICD-10-CM | POA: Diagnosis not present

## 2017-05-23 DIAGNOSIS — D649 Anemia, unspecified: Secondary | ICD-10-CM | POA: Diagnosis not present

## 2017-05-23 DIAGNOSIS — L89309 Pressure ulcer of unspecified buttock, unspecified stage: Secondary | ICD-10-CM

## 2017-05-23 DIAGNOSIS — J90 Pleural effusion, not elsewhere classified: Secondary | ICD-10-CM | POA: Diagnosis not present

## 2017-05-23 DIAGNOSIS — L89159 Pressure ulcer of sacral region, unspecified stage: Secondary | ICD-10-CM

## 2017-05-23 DIAGNOSIS — C8338 Diffuse large B-cell lymphoma, lymph nodes of multiple sites: Secondary | ICD-10-CM | POA: Diagnosis not present

## 2017-05-23 DIAGNOSIS — E8770 Fluid overload, unspecified: Secondary | ICD-10-CM | POA: Diagnosis not present

## 2017-05-23 DIAGNOSIS — E119 Type 2 diabetes mellitus without complications: Secondary | ICD-10-CM | POA: Diagnosis not present

## 2017-08-18 DEATH — deceased

## 2018-08-20 IMAGING — CR DG CHEST 2V
2 series · 2 of 2 positions shown · non-contrast
Comparison: None.

CLINICAL DATA: Acute onset of shortness of breath. Assess pleural
effusion. Initial encounter.

EXAM:
CHEST  2 VIEW

[chest lat]
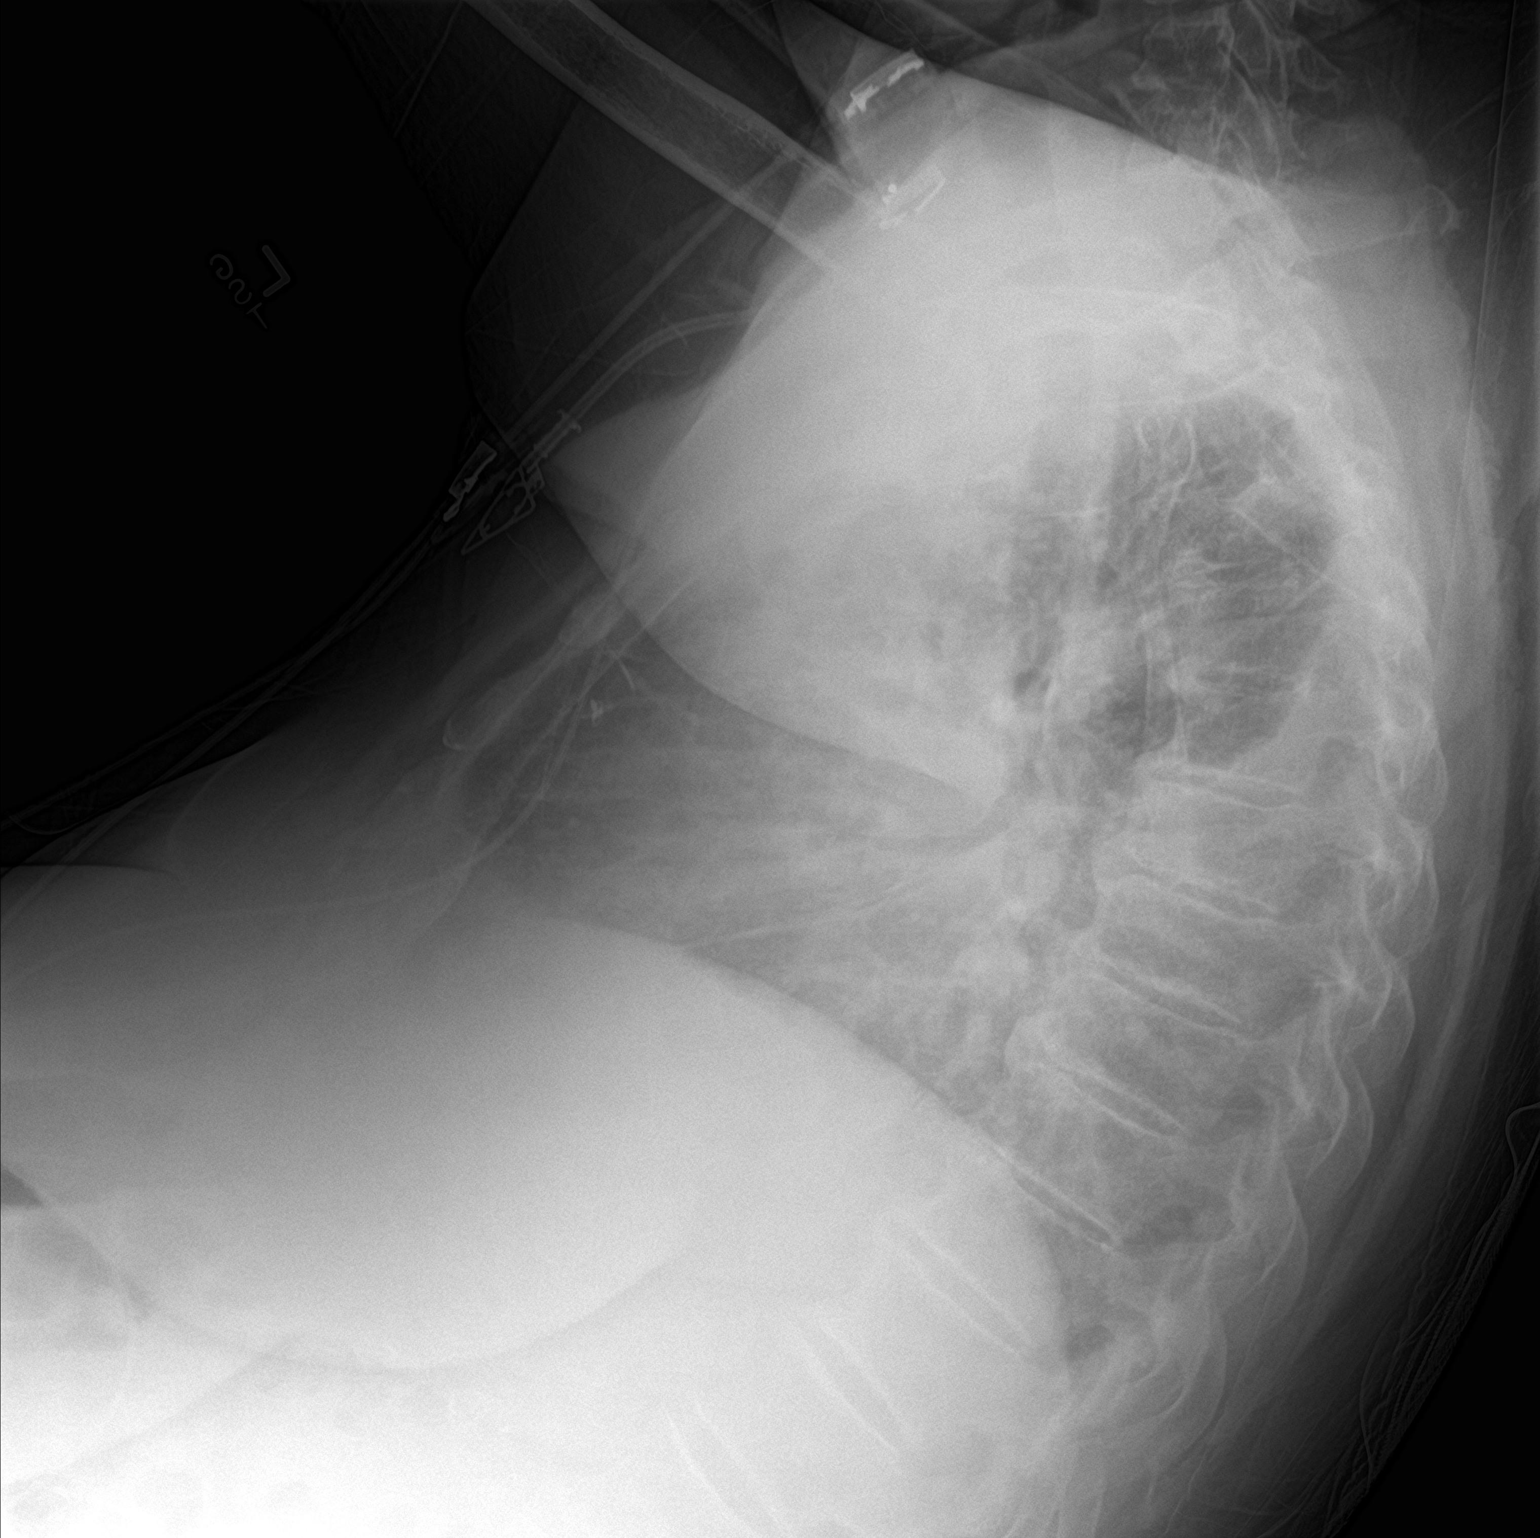

[chest ap]
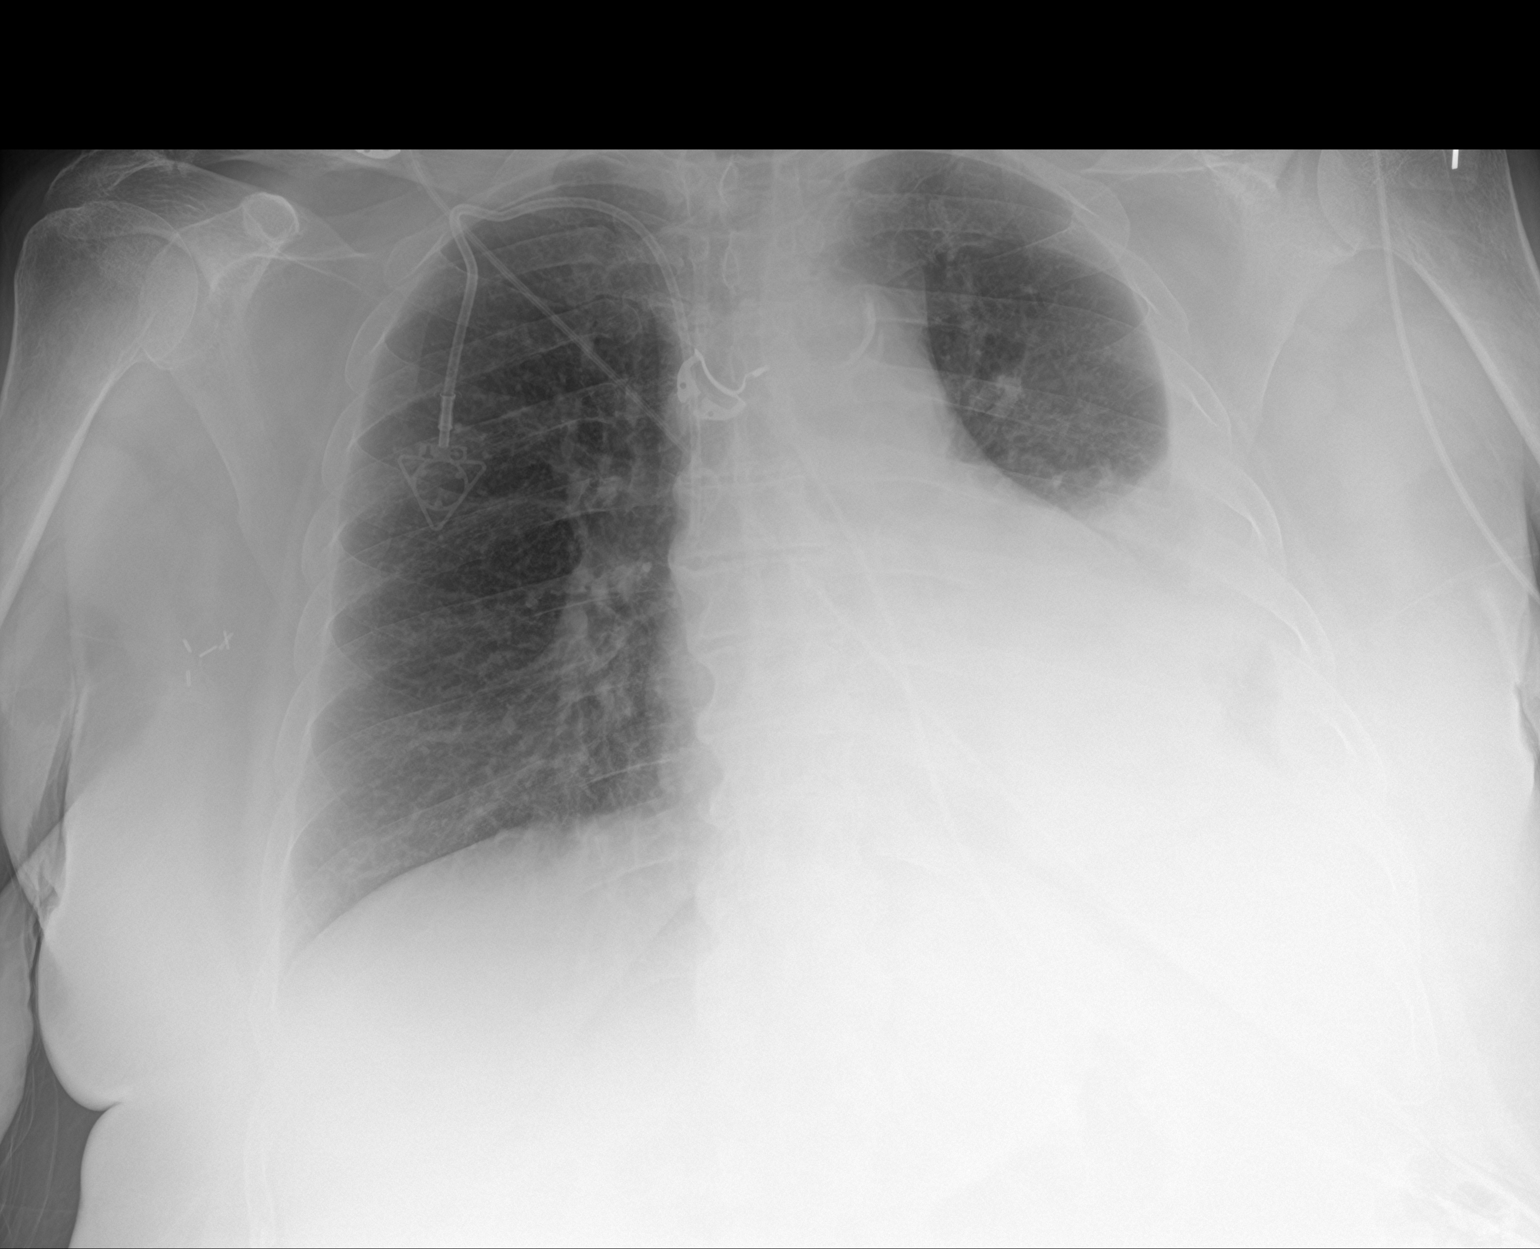

[2 of 2 positions shown; findings below may reference images not displayed]

FINDINGS: A large left-sided pleural effusion is noted, with left basilar
airspace opacification. The right lung appears clear. No
pneumothorax is seen.

The cardiomediastinal silhouette is normal in size. No acute osseous
abnormalities are identified. A right-sided chest port is noted
ending about the mid SVC.
IMPRESSION: Large left-sided pleural effusion, with left basilar airspace
opacification. Underlying mass cannot be excluded. Diagnostic
thoracentesis could be considered for further evaluation, as deemed
clinically appropriate.

## 2018-08-21 IMAGING — DX DG CHEST 1V PORT
1 series · 1 of 1 positions shown · non-contrast
Comparison: 03/09/2017

CLINICAL DATA: Pleural effusion and shortness of breath. Status
post thoracentesis.

EXAM:
PORTABLE CHEST 1 VIEW

[chest ap]
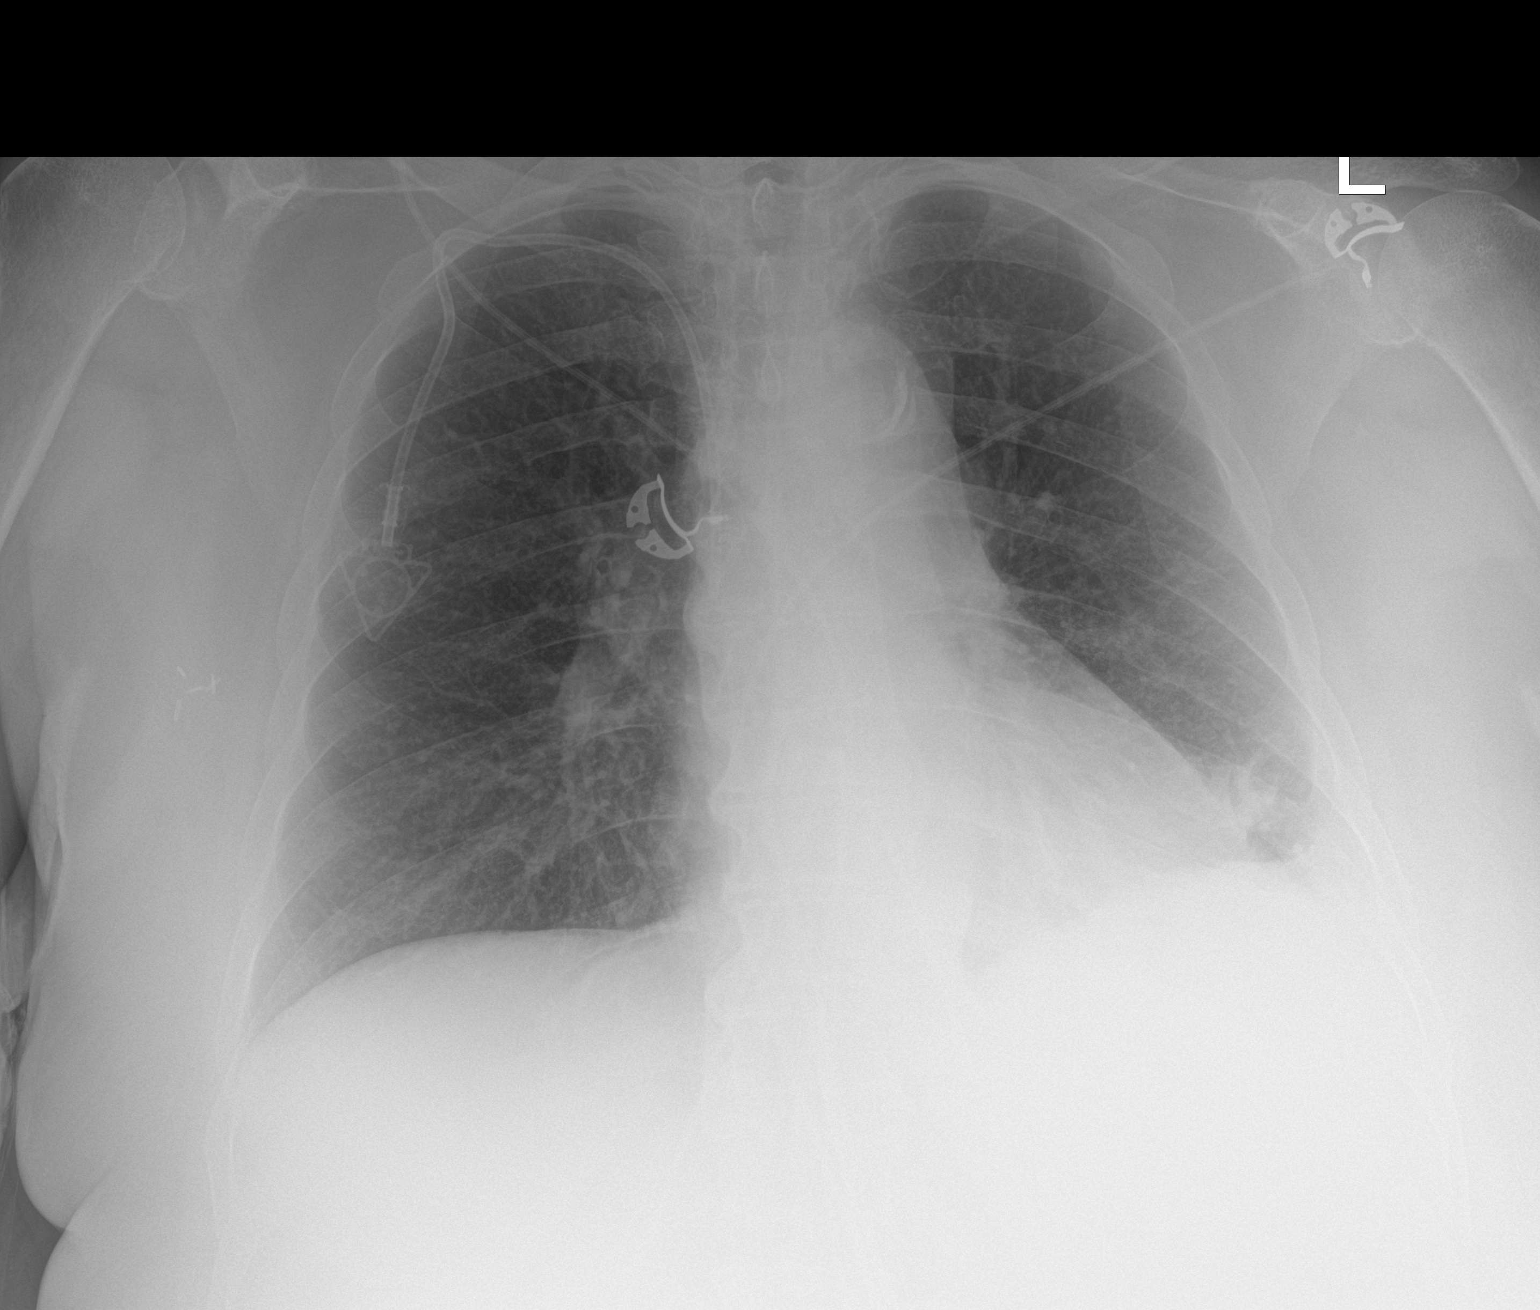

[1 of 1 positions shown; findings below may reference images not displayed]

FINDINGS: 9112 hours. Interval decrease in left pleural effusion, now small in
size. There is some apparent atelectasis at the left base. No
pneumothorax. Right Port-A-Cath is again noted with distal tip
positioned over the mid SVC. Right lung clear. The cardiopericardial
silhouette is within normal limits for size. The visualized bony
structures of the thorax are intact. Telemetry leads overlie the
chest.
IMPRESSION: Interval decrease left pleural effusion without pneumothorax.
